# Patient Record
Sex: Male | Born: 1980 | Race: Asian | Hispanic: No | Marital: Single | Smoking: Never smoker
Health system: Southern US, Community
[De-identification: ages and names within clinical notes are randomized; demographics above are authoritative.]

---

## 2003-03-24 ENCOUNTER — Emergency Department (HOSPITAL_COMMUNITY): Admission: EM | Admit: 2003-03-24 | Discharge: 2003-03-24 | Payer: Self-pay | Admitting: Emergency Medicine

## 2005-09-08 ENCOUNTER — Emergency Department (HOSPITAL_COMMUNITY): Admission: AD | Admit: 2005-09-08 | Discharge: 2005-09-08 | Payer: Self-pay | Admitting: Family Medicine

## 2008-03-31 ENCOUNTER — Emergency Department (HOSPITAL_COMMUNITY): Admission: EM | Admit: 2008-03-31 | Discharge: 2008-03-31 | Payer: Self-pay | Admitting: Emergency Medicine

## 2008-08-17 ENCOUNTER — Emergency Department (HOSPITAL_COMMUNITY): Admission: EM | Admit: 2008-08-17 | Discharge: 2008-08-17 | Payer: Self-pay | Admitting: Family Medicine

## 2011-07-30 ENCOUNTER — Ambulatory Visit (INDEPENDENT_AMBULATORY_CARE_PROVIDER_SITE_OTHER): Payer: 59 | Admitting: Emergency Medicine

## 2011-07-30 VITALS — BP 119/68 | HR 74 | Temp 98.3°F | Resp 18 | Ht 69.0 in | Wt 155.0 lb

## 2011-07-30 DIAGNOSIS — J029 Acute pharyngitis, unspecified: Secondary | ICD-10-CM

## 2011-07-30 DIAGNOSIS — J018 Other acute sinusitis: Secondary | ICD-10-CM

## 2011-07-30 MED ORDER — AMOXICILLIN-POT CLAVULANATE 875-125 MG PO TABS: 1.0000 | ORAL_TABLET | Freq: Two times a day (BID) | ORAL | Status: AC

## 2011-07-30 NOTE — Progress Notes (Signed)
  Subjective:    Patient ID: Tim Patterson, male    DOB: 1980/11/06, 31 y.o.   MRN: [id number]  Sore Throat  This is a new problem. The current episode started in the past 7 days. The problem has been unchanged. Neither side of throat is experiencing more pain than the other. There has been no fever. The pain is at a severity of 3/10. Associated symptoms include congestion and swollen glands. Pertinent negatives include no drooling, ear pain, headaches, hoarse voice, plugged ear sensation, neck pain, stridor, trouble swallowing or vomiting. He has had exposure to strep. He has tried nothing for the symptoms. The treatment provided no relief.  Back Pain Pertinent negatives include no headaches.      Review of Systems  Constitutional: Negative.   HENT: Positive for congestion. Negative for ear pain, hoarse voice, drooling, trouble swallowing and neck pain.   Eyes: Negative.   Respiratory: Negative.  Negative for stridor.   Cardiovascular: Negative.   Gastrointestinal: Negative.  Negative for vomiting.  Genitourinary: Negative.   Musculoskeletal: Negative for back pain.  Neurological: Negative for headaches.       Objective:   Physical Exam  Constitutional: He is oriented to person, place, and time. He appears well-developed and well-nourished.  HENT:  Head: Normocephalic and atraumatic.  Right Ear: External ear normal.  Left Ear: External ear normal.  Nose: Mucosal edema present.  Mouth/Throat: Posterior oropharyngeal erythema present. No oropharyngeal exudate or posterior oropharyngeal edema.  Eyes: Pupils are equal, round, and reactive to light.  Neck: Normal range of motion. Neck supple.  Cardiovascular: Normal rate and regular rhythm.   Pulmonary/Chest: Effort normal and breath sounds normal.  Abdominal: Soft.  Lymphadenopathy:    He has no cervical adenopathy.  Neurological: He is alert and oriented to person, place, and time.  Skin: Skin is warm and dry.            Assessment & Plan:   Results for orders placed in visit on 07/30/11  POCT RAPID STREP A (OFFICE)      Component Value Range   Rapid Strep A Screen Negative  Negative

## 2012-04-20 ENCOUNTER — Ambulatory Visit (INDEPENDENT_AMBULATORY_CARE_PROVIDER_SITE_OTHER): Payer: 59 | Admitting: Family Medicine

## 2012-04-20 ENCOUNTER — Ambulatory Visit: Payer: 59

## 2012-04-20 VITALS — BP 110/68 | HR 74 | Temp 98.1°F | Resp 16 | Ht 69.0 in | Wt 159.2 lb

## 2012-04-20 DIAGNOSIS — R05 Cough: Secondary | ICD-10-CM

## 2012-04-20 DIAGNOSIS — J029 Acute pharyngitis, unspecified: Secondary | ICD-10-CM

## 2012-04-20 DIAGNOSIS — J690 Pneumonitis due to inhalation of food and vomit: Secondary | ICD-10-CM

## 2012-04-20 MED ORDER — CLINDAMYCIN HCL 300 MG PO CAPS: 300.0000 mg | ORAL_CAPSULE | Freq: Three times a day (TID) | ORAL | Status: DC

## 2012-04-20 NOTE — Patient Instructions (Addendum)
Let us know if you get worse or have any difficulty breathing.  We will refer you to pulmonology to see if anything further is needed.  In the meantime use the antibiotics to help with your cough.

## 2012-04-20 NOTE — Progress Notes (Signed)
[facility] [address], [place] [address] [phone]  Date:  04/20/2012   Name:  Tim Patterson   DOB:  03/04/1980   MRN:  [id number]  PCP:  No primary provider on file.    Chief Complaint: Cough and Sore Throat   History of Present Illness:  Tim Patterson is a 32 y.o. very pleasant male patient who presents with the following:  He is here today with a cough and ST- this started 4 days ago.   The cough is non- productive.   He thinks he may have aspirated a lemon seed about 10 days ago.  He "can feel it still there when he coughs." He does not have a stuffy or runny nose, occasional sneezing No fever He is otherwise generally healthy. Non- smoker   There is no problem list on file for this patient.   History reviewed. No pertinent past medical history.  History reviewed. No pertinent past surgical history.  History  Substance Use Topics  . Smoking status: Never Smoker   . Smokeless tobacco: Not on file  . Alcohol Use: No    History reviewed. No pertinent family history.  No Known Allergies  Medication list has been reviewed and updated.  No current outpatient prescriptions on file prior to visit.   No current facility-administered medications on file prior to visit.    Review of Systems:  As per HPI- otherwise negative.   Physical Examination: Filed Vitals:   04/20/12 1409  BP: 110/68  Pulse: 74  Temp: 98.1 F (36.7 C)  Resp: 16   Filed Vitals:   04/20/12 1409  Height: 5\' 9"  (1.753 m)  Weight: 159 lb 3.2 oz (72.213 kg)   Body mass index is 23.5 kg/(m^2). Ideal Body Weight: Weight in (lb) to have BMI = 25: 168.9  GEN: WDWN, NAD, Non-toxic, A & O x 3, looks well HEENT: Atraumatic, Normocephalic. Neck supple. No masses, No LAD. Bilateral TM wnl, oropharynx normal.  PEERL,EOMI.   Ears and Nose: No external deformity. CV: RRR, No M/G/R. No JVD. No thrill. No extra heart sounds. PULM: CTA B, no wheezes or rhonchi. No  retractions. No resp. distress. No accessory muscle use. He does have some ronchi in his right lung ABD: S, NT, ND, +BS. No rebound. No HSM. EXTR: No c/c/e NEURO Normal gait.  PSYCH: Normally interactive. Conversant. Not depressed or anxious appearing.  Calm demeanor.   [facility] reading (PRIMARY) by  Dr. Patsy Lager. Chest x-ray:  Increased markings right lung, no definite pneumonia identified CHEST - 2 VIEW  Comparison: Two-view chest 03/24/2003  Findings: The heart size is normal. The lungs are clear. The visualized soft tissues and bony thorax are unremarkable.  IMPRESSION: Negative chest.  Assessment and Plan: Cough - Plan: DG Chest 2 View  Acute pharyngitis  Aspiration pneumonia - Plan: clindamycin (CLEOCIN) 300 MG capsule, Ambulatory referral to Pulmonology  likely viral ST.  He may have aspirated a lemon seed several days ago.  Will cover with clindamycin- let him know this can cause diarrhea, stop and let us know if he has any problems.  If any problems breathing or other acute changes seek help right away  See patient instructions for more details.     Abbe Amsterdam, MD

## 2012-05-08 ENCOUNTER — Institutional Professional Consult (permissible substitution): Payer: Self-pay | Admitting: Internal Medicine

## 2012-05-21 ENCOUNTER — Ambulatory Visit (INDEPENDENT_AMBULATORY_CARE_PROVIDER_SITE_OTHER): Payer: 59 | Admitting: Family Medicine

## 2012-05-21 VITALS — BP 109/69 | HR 70 | Temp 97.9°F | Resp 16 | Ht 68.5 in | Wt 155.0 lb

## 2012-05-21 DIAGNOSIS — J029 Acute pharyngitis, unspecified: Secondary | ICD-10-CM

## 2012-05-21 DIAGNOSIS — J039 Acute tonsillitis, unspecified: Secondary | ICD-10-CM

## 2012-05-21 LAB — POCT RAPID STREP A (OFFICE): Rapid Strep A Screen: NEGATIVE

## 2012-05-21 MED ORDER — AZITHROMYCIN 250 MG PO TABS: ORAL_TABLET | ORAL | Status: DC

## 2012-05-21 NOTE — Patient Instructions (Addendum)
Vim H?ng Do Vi Rt Vim h?ng do vi rt l b?nh nhi?m trng do vi rt t?o ra t?y ??, ?au v s?ng (vim) ? c? h?ng. N c th? ly t? ng??i sang ng??i (d? ly). NGUYN NHN Vim h?ng do vi rt gy ra do ht ph?i m?t l??ng l?n vi trng nh?t ??nh ???c g?i l vi rt. Nhi?u vi rt khc nhau gy ra vim h?ng do vi rt. TRI?U CH?NG Cc tri?u ch?ng c?a vim h?ng do vi rt bao g?m:  ?au h?ng.  M?t m?i.  Ngh?t m?i.  S?t nh?.  T?c ngh?n.  Ho. ?I?U TR? ?i?u tr? bao g?m ngh? ng?i, u?ng nhi?u n??c v s? d?ng thu?c mua tr?c ti?p t?i hi?u thu?c (???c ph duy?t b?i chuyn gia ch?m [place] y t? c?a b?n). H??NG D?N CH?M [place] T?I NH   U?ng ?? n??c ?? gi? cho n??c ti?u trong ho?c vng nh?t.  ?n th?c ?n m?m, l?nh nh? kem, kem ?ng ? ho?c mn trng mi?ng gelatin.  [place] mi?ng b?ng n??c mu?i ?m (1 mu?ng c ph mu?i m?i 1 qt hay 1 lt n??c).  N?u trn 7 tu?i, vin ng?m h?ng c th? ???c s? d?ng m?t cch an ton.  Ch? s? d?ng thu?c mua tr?c ti?p t?i hi?u thu?c ho?c thu?c theo toa ?? gi?m ?au, gi?m s? kh ch?u ho?c h? s?t theo ch? d?n c?a chuyn gia ch?m  y t? c?a b?n. Khng dng atpirin. ?? gip ng?n ng?a ly lan vi rt vim h?ng cho ng??i khc, hy trnh:  Ti?p xc mi?ng mnh v?i mi?ng ng??i khc.  Dng chung d?ng c? ?n u?ng.  Ho khi c ng??i khc ? c?nh. HY THAM V?N V?I CHUYN GIA Y T? N?U:   B?n kh h?n trong vi ngy, sau ? tr? nn t?i t? h?n.  B?n b? s?t ho?c ?au khng thuyn gi?m sau khi s? d?ng thu?c gi?m ?au.  C b?t k? thay ??i no khc khi?n b?n lo l?ng. Document Released: 02/04/2005 Document Revised: 04/29/2011 [facility] Patient Information 2013 [place], [place].

## 2012-05-21 NOTE — Progress Notes (Signed)
  Subjective:    Patient ID: Tim Patterson, male    DOB: 08/21/1980, 32 y.o.   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  Pt comes in today with headache, runny nose and over all not feeling well since yesterday. He feels like something is stuck in his throat. He has been complaining of a sore throat. He denies any noted fever.   He is a Location manager.  Review of Systems Unknown fever    Objective:   Physical Exam NAD HEENT:  Normal TM's and canals; red pharynx with thin white film,  Neck:  Supple, 1+ cervical adenop anteriorly Skin:  Warm and dry Chest:  Clear Heart: reg, no murmur Results for orders placed in visit on 05/21/12  POCT RAPID STREP A (OFFICE)      Result Value Range   Rapid Strep A Screen Negative  Negative       Assessment & Plan:  Acute pharyngitis  Plan:  OOW tomorrow Salt water gargles

## 2012-05-22 ENCOUNTER — Institutional Professional Consult (permissible substitution): Payer: Self-pay | Admitting: Internal Medicine

## 2012-06-03 ENCOUNTER — Ambulatory Visit (INDEPENDENT_AMBULATORY_CARE_PROVIDER_SITE_OTHER): Payer: 59 | Admitting: Internal Medicine

## 2012-06-03 ENCOUNTER — Encounter: Payer: Self-pay | Admitting: *Deleted

## 2012-06-03 ENCOUNTER — Encounter: Payer: Self-pay | Admitting: Internal Medicine

## 2012-06-03 VITALS — BP 120/78 | HR 91 | Temp 98.0°F | Ht 68.0 in | Wt 161.6 lb

## 2012-06-03 DIAGNOSIS — D869 Sarcoidosis, unspecified: Secondary | ICD-10-CM

## 2012-06-03 DIAGNOSIS — R05 Cough: Secondary | ICD-10-CM

## 2012-06-03 MED ORDER — PREDNISONE (PAK) 10 MG PO TABS: ORAL_TABLET | ORAL | Status: DC

## 2012-06-03 MED ORDER — METHYLPREDNISOLONE ACETATE 80 MG/ML IJ SUSP: 120.0000 mg | Freq: Once | INTRAMUSCULAR | Status: DC

## 2012-06-03 MED ORDER — TRAMADOL HCL 50 MG PO TABS: ORAL_TABLET | ORAL | Status: DC

## 2012-06-03 NOTE — Patient Instructions (Addendum)
The key to effective treatment for your cough is eliminating the non-stop cycle of cough you're stuck in long enough to let your airway heal completely and then see if there is anything still making you cough once you stop the cough suppression, but this should take no more than 5 days to figuure out  First take delsym two tsp every 12 hours and supplement if needed with  tramadol 50 mg up to 2 every 4 hours to suppress the urge to cough. Swallowing water or using ice chips/non mint and menthol containing candies (such as lifesavers or sugarless jolly ranchers) are also effective.  You should rest your voice and avoid activities that you know make you cough.  Once you have eliminated the cough for 3 straight days try reducing the tramadol first,  then the delsym as tolerated.    Try prilosec 20mg   Take 30-60 min before first meal of the day and Pepcid 20 mg one bedtime until cough is completely gone for at least a week without the need for cough suppression   GERD (REFLUX)  is an extremely common cause of respiratory symptoms, many times with no significant heartburn at all.    It can be treated with medication, but also with lifestyle changes including avoidance of late meals, excessive alcohol, smoking cessation, and avoid fatty foods, chocolate, peppermint, colas, red wine, and acidic juices such as orange juice.  NO MINT OR MENTHOL PRODUCTS SO NO COUGH DROPS  USE SUGARLESS CANDY INSTEAD (jolley ranchers or Stover's)  NO OIL BASED VITAMINS - use powdered substitutes.    Prednisone 10 mg take  4 each am x 2 days,   2 each am x 2 days,  1 each am x 2days and stop    If not better by 4/21 call me to set up bronchoscopy at outpatient registration at [facility]

## 2012-06-03 NOTE — Progress Notes (Signed)
  Subjective:    Patient ID: Tim Patterson, male    DOB: 07-Feb-1981, 32 y.o.   MRN: [id number]  HPI  32 yo vietnamese male never smoker with limited English ? Asp lemon seed around Feb 21 with sense of scratchy sore throat seen 04/20/12 in UC and given cleocin > no better so referred 06/03/12 to Pulmonary clinic by Dr Freddi Che Copland  06/03/12 1st pulmonary ov cc persistent dry hacking cough 24/7 x since 3/21 assoc with gagging and can't catch his breath but only sob if coughing.  gen ant chest discomfort with coughing.  No obvious daytime variabilty or   prev chronic coughor asssoc or chest tightness, subjective wheeze overt sinus or hb symptoms. No unusual exp hx or h/o childhood pna/ asthma or premature birth to his knowledge.     Also denies any obvious fluctuation of symptoms with weather or environmental changes or other aggravating or alleviating factors except as outlined above   Review of Systems  Constitutional: Negative for fever, chills, activity change, appetite change and unexpected weight change.  HENT: Positive for sore throat and trouble swallowing. Negative for congestion, rhinorrhea, sneezing, dental problem, voice change and postnasal drip.   Eyes: Negative for visual disturbance.  Respiratory: Positive for cough and shortness of breath. Negative for choking.   Cardiovascular: Positive for chest pain. Negative for leg swelling.  Gastrointestinal: Negative for nausea, vomiting and abdominal pain.  Genitourinary: Negative for difficulty urinating.  Musculoskeletal: Negative for arthralgias.  Skin: Negative for rash.  Psychiatric/Behavioral: Negative for behavioral problems and confusion.       Objective:   Physical Exam  amb wm points to suprasternal notch where he feels the lemon seed is stuck with freq throat clearing.  Wt Readings from Last 3 Encounters:  06/03/12 161 lb 9.6 oz (73.301 kg)  05/21/12 155 lb (70.308 kg)  04/20/12 159 lb 3.2 oz (72.213 kg)    HEENT: nl  dentition, turbinates, and orophanx. Nl external ear canals without cough reflex   NECK :  without JVD/Nodes/TM/ nl carotid upstrokes bilaterally   LUNGS: no acc muscle use, clear to A and P bilaterally without cough on insp or exp maneuvers   CV:  RRR  no s3 or murmur or increase in P2, no edema   ABD:  soft and nontender with nl excursion in the supine position. No bruits or organomegaly, bowel sounds nl  MS:  warm without deformities, calf tenderness, cyanosis or clubbing  SKIN: warm and dry without lesions    NEURO:  alert, approp, no deficits   cxr 04/20/12  Negative chest.       Assessment & Plan:

## 2012-06-05 DIAGNOSIS — R05 Cough: Secondary | ICD-10-CM | POA: Insufficient documentation

## 2012-06-05 NOTE — Assessment & Plan Note (Signed)
The most common causes of chronic cough in immunocompetent adults include the following: upper airway cough syndrome (UACS), previously referred to as postnasal drip syndrome (PNDS), which is caused by variety of rhinosinus conditions; (2) asthma; (3) GERD; (4) chronic bronchitis from cigarette smoking or other inhaled environmental irritants; (5) nonasthmatic eosinophilic bronchitis; and (6) bronchiectasis.   These conditions, singly or in combination, have accounted for up to 94% of the causes of chronic cough in prospective studies.   Other conditions have constituted no >6% of the causes in prospective studies These have included bronchogenic carcinoma, chronic interstitial pneumonia, sarcoidosis, left ventricular failure, ACEI-induced cough, and aspiration from a condition associated with pharyngeal dysfunction.    Chronic cough is often simultaneously caused by more than one condition. A single cause has been found from 38 to 82% of the time, multiple causes from 18 to 62%. Multiply caused cough has been the result of three diseases up to 42% of the time.       Of the three most common causes of chronic cough, only one (GERD)  can actually cause the other two (asthma and post nasal drip syndrome)  and perpetuate the cylce of cough inducing airway trauma, inflammation, heightened sensitivity to reflux which is prompted by the cough itself via a cyclical mechanism.    This may partially respond to steroids and look like asthma and post nasal drainage but never erradicated completely unless the cough and the secondary reflux are eliminated, preferably both at the same time.  While not intuitively obvious, many patients with chronic low grade reflux do not cough until there is a secondary insult that disturbs the protective epithelial barrier and exposes sensitive nerve endings.  This can be viral or direct physical injury such as with an endotracheal tube.   The point is that once this occurs, it is  difficult to eliminate using anything but a maximally effective acid suppression regimen at least in the short run, accompanied by an appropriate diet to address non acid GERD.   He is convinced something is stuck in his throat but this may all be cyclical coughing esp since cxr and exam so clear and he points to his suprasternal notch as to the location of the "stuck seed" which would be nearly impossible given that healthy people can generate cough velocities that approach 80% of the speed of sound.   If not improving though may need fob just to prove to him and his wife that this is not from a "stuck seed"  See instructions for specific recommendations which were reviewed directly with the patient who was given a copy with highlighter outlining the key components.

## 2012-06-16 ENCOUNTER — Telehealth: Payer: Self-pay | Admitting: Internal Medicine

## 2012-06-16 DIAGNOSIS — R05 Cough: Secondary | ICD-10-CM

## 2012-06-16 NOTE — Telephone Encounter (Signed)
I spoke with spouse and she stated MW advised them if pt cough was not better them to call back and would set up bronch. I advised her MW is aware until Thursday but will send the message to him for when he returns. They would like to get this set up MW. Please advise thanks

## 2012-06-16 NOTE — Telephone Encounter (Signed)
The instruction was to call back 4/21 if not better and I can't do fob until end of next week - if that's not satisfactory they will need to see one of my partners in office to set up bronch in interim

## 2012-06-16 NOTE — Telephone Encounter (Signed)
Spoke with pt's spouse and informed of Dr Thurston Hole recommendations .  She states that she will talk to pt and see if he can wait until end of next week for FOB and get back with [place].  Will await call back.

## 2012-06-17 NOTE — Telephone Encounter (Signed)
I spoke with pt spouse and she stated pt would wait until next week to have this done. Please advise MW thanks

## 2012-06-22 NOTE — Addendum Note (Signed)
Addended by: Darrell Jewel on: 06/22/2012 01:42 PM   Modules accepted: Orders

## 2012-06-22 NOTE — Telephone Encounter (Signed)
Spoke with the pt wife and set appt for Monday at 9am with CXR. Carron Curie, CMA

## 2012-06-22 NOTE — Telephone Encounter (Signed)
Schedule f/u ov with all active meds in hand including otcs with cxr same day

## 2012-06-29 ENCOUNTER — Encounter: Payer: Self-pay | Admitting: *Deleted

## 2012-06-29 ENCOUNTER — Ambulatory Visit (INDEPENDENT_AMBULATORY_CARE_PROVIDER_SITE_OTHER): Payer: 59 | Admitting: Internal Medicine

## 2012-06-29 ENCOUNTER — Encounter: Payer: Self-pay | Admitting: Internal Medicine

## 2012-06-29 ENCOUNTER — Ambulatory Visit (INDEPENDENT_AMBULATORY_CARE_PROVIDER_SITE_OTHER)
Admission: RE | Admit: 2012-06-29 | Discharge: 2012-06-29 | Disposition: A | Discharge status: Home / Self Care | Payer: 59 | Source: Ambulatory Visit | Attending: Internal Medicine | Admitting: Internal Medicine

## 2012-06-29 VITALS — BP 106/70 | HR 77 | Temp 98.1°F | Ht 68.0 in | Wt 158.0 lb

## 2012-06-29 DIAGNOSIS — R05 Cough: Secondary | ICD-10-CM

## 2012-06-29 DIAGNOSIS — R059 Cough, unspecified: Secondary | ICD-10-CM

## 2012-06-29 MED ORDER — PANTOPRAZOLE SODIUM 40 MG PO TBEC: 40.0000 mg | DELAYED_RELEASE_TABLET | Freq: Every day | ORAL | Status: DC

## 2012-06-29 NOTE — Assessment & Plan Note (Signed)
-   Acute onset p ? Asp lemon seed 04/10/12  Very seriously doubt there is a retained lemon seed LUL with nothing on xray or exam now months after onset of symptoms but pt convinced something is stuck there and can't or won't follow instructions re elimination of cyclical cough.  Discussed in detail all the  indications, usual  risks and alternatives  relative to the benefits with patient who agrees to proceed with bronchoscopy  And in meantime try again to get 24 acid suppression and follow diet.  Wife served as interpreter but noted at end of ov she demonstrated a skeptical affect re dx and management options so I asked her what she was studying in school with response "I'd rather not tell you, my husband is the pt" I made it clear to her and as much as possible to him that we are all on the same team to help get rid of this cough and offered a second opinion which she/ he declined.

## 2012-06-29 NOTE — Patient Instructions (Addendum)
Pantoprazole (protonix) 40 mg   Take 30-60 min before first meal of the day and Pepcid 20 mg one bedtime until return to office - this is the best way to tell whether stomach acid is contributing to your problem.    GERD (REFLUX)  is an extremely common cause of respiratory symptoms, many times with no significant heartburn at all.    It can be treated with medication, but also with lifestyle changes including avoidance of late meals, excessive alcohol, smoking cessation, and avoid fatty foods, chocolate, peppermint, colas, red wine, and acidic juices such as orange juice.  NO MINT OR MENTHOL PRODUCTS SO NO COUGH DROPS  USE SUGARLESS CANDY INSTEAD (jolley ranchers or Stover's)  NO OIL BASED VITAMINS - use powdered substitutes.    You are scheduled for bronchoscopy for May 19th - come to outpatient registration at [facility] noon and ok for juices in am but nothing after 8 am

## 2012-06-29 NOTE — Progress Notes (Signed)
Subjective:    Patient ID: Tim Patterson, male    DOB: 1980/02/20, 32 y.o.   MRN: [id number]  HPI  32 yo vietnamese male never smoker with limited English ? Asp lemon seed around Feb 21 with sense of scratchy sore throat seen 04/20/12 in UC and given cleocin > no better so referred 06/03/12 to Pulmonary clinic by Dr Freddi Che Copland  06/03/12 1st pulmonary ov cc persistent dry hacking cough 24/7 x since 2/21 assoc with gagging and can't catch his breath but only sob if coughing.  gen ant chest discomfort with coughing. rec First take delsym two tsp every 12 hours and supplement if needed with  tramadol 50 mg up to 2 every 4 hours to suppress the urge to cough.    Try prilosec 20mg   Take 30-60 min before first meal of the day and Pepcid 20 mg one bedtime until cough is completely gone for at least a week without the need for cough suppression GERD diet   Prednisone 10 mg take  4 each am x 2 days,   2 each am x 2 days,  1 each am x 2days and stop    06/29/2012 f/u ov/Keyani Rigdon re cough Chief Complaint  Patient presents with  . Follow-up    Cough is slightly improved since the last visit, mainly non prod but sometimes produces minimal yellow to clear sputum.   does not appear to have used acid suppression as rec nor ever eliminated cyclical cough. No sob. Cough seems worse p supper than any other time  No obvious daytime variabilty or   prev chronic coughor asssoc or chest tightness, subjective wheeze overt sinus or hb symptoms. No unusual exp hx or h/o childhood pna/ asthma or premature birth to his knowledge.   Current Medications, Allergies, Past Medical History, Past Surgical History, Family History, and Social History were reviewed in [employer] record.  ROS  The following are not active complaints unless bolded sore throat, dysphagia, dental problems, itching, sneezing,  nasal congestion or excess/ purulent secretions, ear ache,   fever, chills, sweats, unintended wt loss,  pleuritic or exertional cp, hemoptysis,  orthopnea pnd or leg swelling, presyncope, palpitations, heartburn, abdominal pain, anorexia, nausea, vomiting, diarrhea  or change in bowel or urinary habits, change in stools or urine, dysuria,hematuria,  rash, arthralgias, visual complaints, headache, numbness weakness or ataxia or problems with walking or coordination,  change in mood/affect or memory.              Objective:   Physical Exam  amb wm points to  L upper parasternal area where feels something stuck, speaks very little [place], wife serving as Equities trader.   06/29/2012       158  Wt Readings from Last 3 Encounters:  06/03/12 161 lb 9.6 oz (73.301 kg)  05/21/12 155 lb (70.308 kg)  04/20/12 159 lb 3.2 oz (72.213 kg)    HEENT: nl dentition, turbinates, and orophanx. Nl external ear canals without cough reflex   NECK :  without JVD/Nodes/TM/ nl carotid upstrokes bilaterally   LUNGS: no acc muscle use, clear to A and P bilaterally without cough on insp or exp maneuvers   CV:  RRR  no s3 or murmur or increase in P2, no edema   ABD:  soft and nontender with nl excursion in the supine position. No bruits or organomegaly, bowel sounds nl  MS:  warm without deformities, calf tenderness, cyanosis or clubbing  SKIN: warm and dry without lesions  NEURO:  alert, approp, no deficits      CXR  06/29/2012 :  Normal chest radiography.         Assessment & Plan:

## 2012-07-06 ENCOUNTER — Inpatient Hospital Stay (HOSPITAL_COMMUNITY): Admission: RE | Admit: 2012-07-06 | Payer: 59 | Source: Ambulatory Visit

## 2012-07-06 ENCOUNTER — Ambulatory Visit (HOSPITAL_COMMUNITY): Admission: RE | Admit: 2012-07-06 | Payer: 59 | Source: Ambulatory Visit | Admitting: Internal Medicine

## 2012-07-06 ENCOUNTER — Encounter (HOSPITAL_COMMUNITY): Admission: RE | Payer: Self-pay | Source: Ambulatory Visit

## 2012-07-06 SURGERY — VIDEO BRONCHOSCOPY WITHOUT FLUORO
Anesthesia: Moderate Sedation | Laterality: Bilateral

## 2012-09-10 ENCOUNTER — Ambulatory Visit: Payer: 59

## 2012-09-10 ENCOUNTER — Ambulatory Visit (INDEPENDENT_AMBULATORY_CARE_PROVIDER_SITE_OTHER): Payer: 59 | Admitting: Family Medicine

## 2012-09-10 VITALS — BP 110/78 | HR 85 | Temp 98.3°F | Resp 18 | Ht 69.0 in | Wt 155.0 lb

## 2012-09-10 DIAGNOSIS — M545 Low back pain: Secondary | ICD-10-CM

## 2012-09-10 DIAGNOSIS — S39011A Strain of muscle, fascia and tendon of abdomen, initial encounter: Secondary | ICD-10-CM

## 2012-09-10 DIAGNOSIS — IMO0002 Reserved for concepts with insufficient information to code with codable children: Secondary | ICD-10-CM

## 2012-09-10 DIAGNOSIS — R1013 Epigastric pain: Secondary | ICD-10-CM

## 2012-09-10 DIAGNOSIS — S39012A Strain of muscle, fascia and tendon of lower back, initial encounter: Secondary | ICD-10-CM

## 2012-09-10 DIAGNOSIS — S335XXA Sprain of ligaments of lumbar spine, initial encounter: Secondary | ICD-10-CM

## 2012-09-10 LAB — POCT URINALYSIS DIPSTICK
Ketones, UA: NEGATIVE
Leukocytes, UA: NEGATIVE
Nitrite, UA: NEGATIVE
Protein, UA: NEGATIVE
pH, UA: 6

## 2012-09-10 LAB — POCT UA - MICROSCOPIC ONLY
Casts, Ur, LPF, POC: NEGATIVE
Mucus, UA: NEGATIVE
Yeast, UA: NEGATIVE

## 2012-09-10 MED ORDER — CYCLOBENZAPRINE HCL 5 MG PO TABS: ORAL_TABLET | ORAL | Status: DC

## 2012-09-10 NOTE — Progress Notes (Signed)
Subjective:    Patient ID: Tim Patterson, male    DOB: 1980/11/09, 32 y.o.   MRN: [id number]  HPI [name] is a 32 y.o. 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   Lower R back pain started yesterday around 4pm.  Did jump off trash can at work about 4 hours earlier, but no pain at the time, NKI. Noticed soreness in R lower back.   Also with some slight upper abdomen soreness - middle (not with lying down - just notices abd pain and back pain with standing).  No n/v/fever or change in stools. No prior similar sx's. No known hx of kidney stones.   Tx: topical spray.   SH: Works as Location manager.  Review of Systems  Constitutional: Negative for chills.  Gastrointestinal: Positive for abdominal pain. Negative for nausea, vomiting, diarrhea, constipation (normal BM this am. ), blood in stool, abdominal distention and anal bleeding.  Genitourinary: Negative for dysuria, urgency, frequency, hematuria, decreased urine volume, discharge, difficulty urinating, penile pain and testicular pain.  Musculoskeletal: Positive for back pain.  Skin: Negative for rash.       Objective:   Physical Exam  Vitals reviewed. Constitutional: He is oriented to person, place, and time. He appears well-developed and well-nourished.  HENT:  Head: Normocephalic and atraumatic.  Neck: Normal range of motion.  Pulmonary/Chest: Effort normal.  Abdominal: Soft. Bowel sounds are normal. He exhibits no distension. There is no tenderness. There is no rebound and no guarding.  nontender on exam.  Only notes soreness with standing. No defect appreciated.   Musculoskeletal: He exhibits tenderness.       Lumbar back: He exhibits tenderness and spasm. He exhibits normal range of motion and no bony tenderness.  Neurological: He is alert and oriented to person, place, and time. He has normal strength. No sensory deficit. He displays no Babinski's sign on the right side. He displays no Babinski's sign on the left side.  Reflex Scores:  Patellar reflexes are 2+ on the right side and 2+ on the left side.      Achilles reflexes are 2+ on the right side and 2+ on the left side. Able to heel and toe walk without difficulty. Negative straight leg raise.   Skin: Skin is warm and dry.  Psychiatric: He has a normal mood and affect. His behavior is normal.    [facility] reading (PRIMARY) by  Dr. Neva Seat: LS spine: LS spine: no apparent fx, NAD.  Results for orders placed in visit on 09/10/12  POCT URINALYSIS DIPSTICK      Result Value Range   Color, UA yellow     Clarity, UA clear     Glucose, UA neg     Bilirubin, UA neg     Ketones, UA neg     Spec Grav, UA >=1.030     Blood, UA neg     pH, UA 6.0     Protein, UA neg     Urobilinogen, UA 0.2     Nitrite, UA neg     Leukocytes, UA Negative    POCT UA - MICROSCOPIC ONLY      Result Value Range   WBC, Ur, HPF, POC neg     RBC, urine, microscopic neg     Bacteria, U Microscopic neg     Mucus, UA neg     Epithelial cells, urine per micros neg     Crystals, Ur, HPF, POC neg     Casts, Ur, LPF, POC neg  Yeast, UA neg         Assessment & Plan:  Tim Patterson is a 32 y.o. male Lumbar pain - Plan: POCT urinalysis dipstick, POCT UA - Microscopic Only, DG Lumbar Spine Complete  Abdominal pain, epigastric - Plan: POCT urinalysis dipstick, POCT UA - Microscopic Only, DG Lumbar Spine Complete  Abdominal wall strain, initial encounter - Plan: cyclobenzaprine (FLEXERIL) 5 MG tablet  Low back strain, initial encounter - Plan: cyclobenzaprine (FLEXERIL) 5 MG tablet  Suspected abd wall and low back strain after jumping yesterday.  Reassuring U/a. Trial of otc NSAID prn, flexeril if needed- SED, and heat/ice/rom as tolerated.  recheck if not improved in next 4-5 days. Sooner if worse.  HEP per handout, letter for work today - out x 2 days, unless better tomorrow.   Meds ordered this encounter  Medications  . cyclobenzaprine (FLEXERIL) 5 MG tablet    Sig: 1 pill by mouth up to every  8 hours as needed. Start with one pill by mouth each bedtime as needed due to sedation    Dispense:  15 tablet    Refill:  0   Patient Instructions  advil or alleve over the counter if needed. Heat or ice to area for 15 minutes at a time as needed. Gentle stretches and range of motion - see below.  If not improving in next 4-5 days, or worse sooner - return for recheck.  Return to the clinic or go to the nearest emergency room if any of your symptoms worsen or new symptoms occur. Low Back Strain with Rehab A strain is an injury in which a tendon or muscle is torn. The muscles and tendons of the lower back are vulnerable to strains. However, these muscles and tendons are very strong and require a great force to be injured. Strains are classified into three categories. Grade 1 strains cause pain, but the tendon is not lengthened. Grade 2 strains include a lengthened ligament, due to the ligament being stretched or partially ruptured. With grade 2 strains there is still function, although the function may be decreased. Grade 3 strains involve a complete tear of the tendon or muscle, and function is usually impaired. SYMPTOMS   Pain in the lower back.  Pain that affects one side more than the other.  Pain that gets worse with movement and may be felt in the hip, buttocks, or back of the thigh.  Muscle spasms of the muscles in the back.  Swelling along the muscles of the back.  Loss of strength of the back muscles.  Crackling sound (crepitation) when the muscles are touched. CAUSES  Lower back strains occur when a force is placed on the muscles or tendons that is greater than they can handle. Common causes of injury include:  Prolonged overuse of the muscle-tendon units in the lower back, usually from incorrect posture.  A single violent injury or force applied to the back. RISK INCREASES WITH:  Sports that involve twisting forces on the spine or a lot of bending at the waist (football,  rugby, weightlifting, bowling, golf, tennis, speed skating, racquetball, swimming, running, gymnastics, diving).  Poor strength and flexibility.  Failure to warm up properly before activity.  Family history of lower back pain or disk disorders.  Previous back injury or surgery (especially fusion).  Poor posture with lifting, especially heavy objects.  Prolonged sitting, especially with poor posture. PREVENTION   Learn and use proper posture when sitting or lifting (maintain proper posture when sitting, lift  using the knees and legs, not at the waist).  Warm up and stretch properly before activity.  Allow for adequate recovery between workouts.  Maintain physical fitness:  Strength, flexibility, and endurance.  Cardiovascular fitness. PROGNOSIS  If treated properly, lower back strains usually heal within 6 weeks. RELATED COMPLICATIONS   Recurring symptoms, resulting in a chronic problem.  Chronic inflammation, scarring, and partial muscle-tendon tear.  Delayed healing or resolution of symptoms.  Prolonged disability. TREATMENT  Treatment first involves the use of ice and medicine, to reduce pain and inflammation. The use of strengthening and stretching exercises may help reduce pain with activity. These exercises may be performed at home or with a therapist. Severe injuries may require referral to a therapist for further evaluation and treatment, such as ultrasound. Your caregiver may advise that you wear a back brace or corset, to help reduce pain and discomfort. Often, prolonged bed rest results in greater harm then benefit. Corticosteroid injections may be recommended. However, these should be reserved for the most serious cases. It is important to avoid using your back when lifting objects. At night, sleep on your back on a firm mattress with a pillow placed under your knees. If non-surgical treatment is unsuccessful, surgery may be needed.  MEDICATION   If pain medicine  is needed, nonsteroidal anti-inflammatory medicines (aspirin and ibuprofen), or other minor pain relievers (acetaminophen), are often advised.  Do not take pain medicine for 7 days before surgery.  Prescription pain relievers may be given, if your caregiver thinks they are needed. Use only as directed and only as much as you need.  Ointments applied to the skin may be helpful.  Corticosteroid injections may be given by your caregiver. These injections should be reserved for the most serious cases, because they may only be given a certain number of times. HEAT AND COLD  Cold treatment (icing) should be applied for 10 to 15 minutes every 2 to 3 hours for inflammation and pain, and immediately after activity that aggravates your symptoms. Use ice packs or an ice massage.  Heat treatment may be used before performing stretching and strengthening activities prescribed by your caregiver, physical therapist, or athletic trainer. Use a heat pack or a warm water soak. SEEK MEDICAL CARE IF:   Symptoms get worse or do not improve in 2 to 4 weeks, despite treatment.  You develop numbness, weakness, or loss of bowel or bladder function.  New, unexplained symptoms develop. (Drugs used in treatment may produce side effects.) EXERCISES  RANGE OF MOTION (ROM) AND STRETCHING EXERCISES - Low Back Strain Most people with lower back pain will find that their symptoms get worse with excessive bending forward (flexion) or arching at the lower back (extension). The exercises which will help resolve your symptoms will focus on the opposite motion.  Your physician, physical therapist or athletic trainer will help you determine which exercises will be most helpful to resolve your lower back pain. Do not complete any exercises without first consulting with your caregiver. Discontinue any exercises which make your symptoms worse until you speak to your caregiver.  If you have pain, numbness or tingling which travels  down into your buttocks, leg or foot, the goal of the therapy is for these symptoms to move closer to your back and eventually resolve. Sometimes, these leg symptoms will get better, but your lower back pain may worsen. This is typically an indication of progress in your rehabilitation. Be very alert to any changes in your symptoms and the  activities in which you participated in the 24 hours prior to the change. Sharing this information with your caregiver will allow him/her to most efficiently treat your condition.  These exercises may help you when beginning to rehabilitate your injury. Your symptoms may resolve with or without further involvement from your physician, physical therapist or athletic trainer. While completing these exercises, remember:  Restoring tissue flexibility helps normal motion to return to the joints. This allows healthier, less painful movement and activity.  An effective stretch should be held for at least 30 seconds.  A stretch should never be painful. You should only feel a gentle lengthening or release in the stretched tissue. FLEXION RANGE OF MOTION AND STRETCHING EXERCISES: STRETCH  Flexion, Single Knee to Chest   Lie on a firm bed or floor with both legs extended in front of you.  Keeping one leg in contact with the floor, bring your opposite knee to your chest. Hold your leg in place by either grabbing behind your thigh or at your knee.  Pull until you feel a gentle stretch in your lower back. Hold __________ seconds.  Slowly release your grasp and repeat the exercise with the opposite side. Repeat __________ times. Complete this exercise __________ times per day.  STRETCH  Flexion, Double Knee to Chest   Lie on a firm bed or floor with both legs extended in front of you.  Keeping one leg in contact with the floor, bring your opposite knee to your chest.  Tense your stomach muscles to support your back and then lift your other knee to your chest. Hold your  legs in place by either grabbing behind your thighs or at your knees.  Pull both knees toward your chest until you feel a gentle stretch in your lower back. Hold __________ seconds.  Tense your stomach muscles and slowly return one leg at a time to the floor. Repeat __________ times. Complete this exercise __________ times per day.  STRETCH  Low Trunk Rotation  Lie on a firm bed or floor. Keeping your legs in front of you, bend your knees so they are both pointed toward the ceiling and your feet are flat on the floor.  Extend your arms out to the side. This will stabilize your upper body by keeping your shoulders in contact with the floor.  Gently and slowly drop both knees together to one side until you feel a gentle stretch in your lower back. Hold for __________ seconds.  Tense your stomach muscles to support your lower back as you bring your knees back to the starting position. Repeat the exercise to the other side. Repeat __________ times. Complete this exercise __________ times per day  EXTENSION RANGE OF MOTION AND FLEXIBILITY EXERCISES: STRETCH  Extension, Prone on Elbows   Lie on your stomach on the floor, a bed will be too soft. Place your palms about shoulder width apart and at the height of your head.  Place your elbows under your shoulders. If this is too painful, stack pillows under your chest.  Allow your body to relax so that your hips drop lower and make contact more completely with the floor.  Hold this position for __________ seconds.  Slowly return to lying flat on the floor. Repeat __________ times. Complete this exercise __________ times per day.  RANGE OF MOTION  Extension, Prone Press Ups  Lie on your stomach on the floor, a bed will be too soft. Place your palms about shoulder width apart and at the height  of your head.  Keeping your back as relaxed as possible, slowly straighten your elbows while keeping your hips on the floor. You may adjust the placement  of your hands to maximize your comfort. As you gain motion, your hands will come more underneath your shoulders.  Hold this position __________ seconds.  Slowly return to lying flat on the floor. Repeat __________ times. Complete this exercise __________ times per day.  RANGE OF MOTION- Quadruped, Neutral Spine   Assume a hands and knees position on a firm surface. Keep your hands under your shoulders and your knees under your hips. You may place padding under your knees for comfort.  Drop your head and point your tail bone toward the ground below you. This will round out your lower back like an angry cat. Hold this position for __________ seconds.  Slowly lift your head and release your tail bone so that your back sags into a large arch, like an old horse.  Hold this position for __________ seconds.  Repeat this until you feel limber in your lower back.  Now, find your "sweet spot." This will be the most comfortable position somewhere between the two previous positions. This is your neutral spine. Once you have found this position, tense your stomach muscles to support your lower back.  Hold this position for __________ seconds. Repeat __________ times. Complete this exercise __________ times per day.  STRENGTHENING EXERCISES - Low Back Strain These exercises may help you when beginning to rehabilitate your injury. These exercises should be done near your "sweet spot." This is the neutral, low-back arch, somewhere between fully rounded and fully arched, that is your least painful position. When performed in this safe range of motion, these exercises can be used for people who have either a flexion or extension based injury. These exercises may resolve your symptoms with or without further involvement from your physician, physical therapist or athletic trainer. While completing these exercises, remember:   Muscles can gain both the endurance and the strength needed for everyday activities  through controlled exercises.  Complete these exercises as instructed by your physician, physical therapist or athletic trainer. Increase the resistance and repetitions only as guided.  You may experience muscle soreness or fatigue, but the pain or discomfort you are trying to eliminate should never worsen during these exercises. If this pain does worsen, stop and make certain you are following the directions exactly. If the pain is still present after adjustments, discontinue the exercise until you can discuss the trouble with your caregiver. STRENGTHENING Deep Abdominals, Pelvic Tilt  Lie on a firm bed or floor. Keeping your legs in front of you, bend your knees so they are both pointed toward the ceiling and your feet are flat on the floor.  Tense your lower abdominal muscles to press your lower back into the floor. This motion will rotate your pelvis so that your tail bone is scooping upwards rather than pointing at your feet or into the floor.  With a gentle tension and even breathing, hold this position for __________ seconds. Repeat __________ times. Complete this exercise __________ times per day.  STRENGTHENING  Abdominals, Crunches   Lie on a firm bed or floor. Keeping your legs in front of you, bend your knees so they are both pointed toward the ceiling and your feet are flat on the floor. Cross your arms over your chest.  Slightly tip your chin down without bending your neck.  Tense your abdominals and slowly lift your trunk  high enough to just clear your shoulder blades. Lifting higher can put excessive stress on the lower back and does not further strengthen your abdominal muscles.  Control your return to the starting position. Repeat __________ times. Complete this exercise __________ times per day.  STRENGTHENING  Quadruped, Opposite UE/LE Lift   Assume a hands and knees position on a firm surface. Keep your hands under your shoulders and your knees under your hips. You may  place padding under your knees for comfort.  Find your neutral spine and gently tense your abdominal muscles so that you can maintain this position. Your shoulders and hips should form a rectangle that is parallel with the floor and is not twisted.  Keeping your trunk steady, lift your right hand no higher than your shoulder and then your left leg no higher than your hip. Make sure you are not holding your breath. Hold this position __________ seconds.  Continuing to keep your abdominal muscles tense and your back steady, slowly return to your starting position. Repeat with the opposite arm and leg. Repeat __________ times. Complete this exercise __________ times per day.  STRENGTHENING  Lower Abdominals, Double Knee Lift  Lie on a firm bed or floor. Keeping your legs in front of you, bend your knees so they are both pointed toward the ceiling and your feet are flat on the floor.  Tense your abdominal muscles to brace your lower back and slowly lift both of your knees until they come over your hips. Be certain not to hold your breath.  Hold __________ seconds. Using your abdominal muscles, return to the starting position in a slow and controlled manner. Repeat __________ times. Complete this exercise __________ times per day.  POSTURE AND BODY MECHANICS CONSIDERATIONS - Low Back Strain Keeping correct posture when sitting, standing or completing your activities will reduce the stress put on different body tissues, allowing injured tissues a chance to heal and limiting painful experiences. The following are general guidelines for improved posture. Your physician or physical therapist will provide you with any instructions specific to your needs. While reading these guidelines, remember:  The exercises prescribed by your provider will help you have the flexibility and strength to maintain correct postures.  The correct posture provides the best environment for your joints to work. All of your  joints have less wear and tear when properly supported by a spine with good posture. This means you will experience a healthier, less painful body.  Correct posture must be practiced with all of your activities, especially prolonged sitting and standing. Correct posture is as important when doing repetitive low-stress activities (typing) as it is when doing a single heavy-load activity (lifting). RESTING POSITIONS Consider which positions are most painful for you when choosing a resting position. If you have pain with flexion-based activities (sitting, bending, stooping, squatting), choose a position that allows you to rest in a less flexed posture. You would want to avoid curling into a fetal position on your side. If your pain worsens with extension-based activities (prolonged standing, working overhead), avoid resting in an extended position such as sleeping on your stomach. Most people will find more comfort when they rest with their spine in a more neutral position, neither too rounded nor too arched. Lying on a non-sagging bed on your side with a pillow between your knees, or on your back with a pillow under your knees will often provide some relief. Keep in mind, being in any one position for a prolonged period of  time, no matter how correct your posture, can still lead to stiffness. PROPER SITTING POSTURE In order to minimize stress and discomfort on your spine, you must sit with correct posture. Sitting with good posture should be effortless for a healthy body. Returning to good posture is a gradual process. Many people can work toward this most comfortably by using various supports until they have the flexibility and strength to maintain this posture on their own. When sitting with proper posture, your ears will fall over your shoulders and your shoulders will fall over your hips. You should use the back of the chair to support your upper back. Your lower back will be in a neutral position, just  slightly arched. You may place a small pillow or folded towel at the base of your lower back for support.  When working at a desk, create an environment that supports good, upright posture. Without extra support, muscles tire, which leads to excessive strain on joints and other tissues. Keep these recommendations in mind: CHAIR:  A chair should be able to slide under your desk when your back makes contact with the back of the chair. This allows you to work closely.  The chair's height should allow your eyes to be level with the upper part of your monitor and your hands to be slightly lower than your elbows. BODY POSITION  Your feet should make contact with the floor. If this is not possible, use a foot rest.  Keep your ears over your shoulders. This will reduce stress on your neck and lower back. INCORRECT SITTING POSTURES  If you are feeling tired and unable to assume a healthy sitting posture, do not slouch or slump. This puts excessive strain on your back tissues, causing more damage and pain. Healthier options include:  Using more support, like a lumbar pillow.  Switching tasks to something that requires you to be upright or walking.  Talking a brief walk.  Lying down to rest in a neutral-spine position. PROLONGED STANDING WHILE SLIGHTLY LEANING FORWARD  When completing a task that requires you to lean forward while standing in one place for a long time, place either foot up on a stationary 2-4 inch high object to help maintain the best posture. When both feet are on the ground, the lower back tends to lose its slight inward curve. If this curve flattens (or becomes too large), then the back and your other joints will experience too much stress, tire more quickly, and can cause pain. CORRECT STANDING POSTURES Proper standing posture should be assumed with all daily activities, even if they only take a few moments, like when brushing your teeth. As in sitting, your ears should fall over  your shoulders and your shoulders should fall over your hips. You should keep a slight tension in your abdominal muscles to brace your spine. Your tailbone should point down to the ground, not behind your body, resulting in an over-extended swayback posture.  INCORRECT STANDING POSTURES  Common incorrect standing postures include a forward head, locked knees and/or an excessive swayback. WALKING Walk with an upright posture. Your ears, shoulders and hips should all line-up. PROLONGED ACTIVITY IN A FLEXED POSITION When completing a task that requires you to bend forward at your waist or lean over a low surface, try to find a way to stabilize 3 out of 4 of your limbs. You can place a hand or elbow on your thigh or rest a knee on the surface you are reaching across. This will  provide you more stability so that your muscles do not fatigue as quickly. By keeping your knees relaxed, or slightly bent, you will also reduce stress across your lower back. CORRECT LIFTING TECHNIQUES DO :   Assume a wide stance. This will provide you more stability and the opportunity to get as close as possible to the object which you are lifting.  Tense your abdominals to brace your spine. Bend at the knees and hips. Keeping your back locked in a neutral-spine position, lift using your leg muscles. Lift with your legs, keeping your back straight.  Test the weight of unknown objects before attempting to lift them.  Try to keep your elbows locked down at your sides in order get the best strength from your shoulders when carrying an object.  Always ask for help when lifting heavy or awkward objects. INCORRECT LIFTING TECHNIQUES DO NOT:   Lock your knees when lifting, even if it is a small object.  Bend and twist. Pivot at your feet or move your feet when needing to change directions.  Assume that you can safely pick up even a paper clip without proper posture. Document Released: 02/04/2005 Document Revised: 04/29/2011  Document Reviewed: 05/19/2008 [facility] Patient Information 2014 [place], [place].

## 2012-09-10 NOTE — Patient Instructions (Addendum)
advil or alleve over the counter if needed. Heat or ice to area for 15 minutes at a time as needed. Gentle stretches and range of motion - see below.  If not improving in next 4-5 days, or worse sooner - return for recheck.  Return to the clinic or go to the nearest emergency room if any of your symptoms worsen or new symptoms occur. Low Back Strain with Rehab A strain is an injury in which a tendon or muscle is torn. The muscles and tendons of the lower back are vulnerable to strains. However, these muscles and tendons are very strong and require a great force to be injured. Strains are classified into three categories. Grade 1 strains cause pain, but the tendon is not lengthened. Grade 2 strains include a lengthened ligament, due to the ligament being stretched or partially ruptured. With grade 2 strains there is still function, although the function may be decreased. Grade 3 strains involve a complete tear of the tendon or muscle, and function is usually impaired. SYMPTOMS   Pain in the lower back.  Pain that affects one side more than the other.  Pain that gets worse with movement and may be felt in the hip, buttocks, or back of the thigh.  Muscle spasms of the muscles in the back.  Swelling along the muscles of the back.  Loss of strength of the back muscles.  Crackling sound (crepitation) when the muscles are touched. CAUSES  Lower back strains occur when a force is placed on the muscles or tendons that is greater than they can handle. Common causes of injury include:  Prolonged overuse of the muscle-tendon units in the lower back, usually from incorrect posture.  A single violent injury or force applied to the back. RISK INCREASES WITH:  Sports that involve twisting forces on the spine or a lot of bending at the waist (football, rugby, weightlifting, bowling, golf, tennis, speed skating, racquetball, swimming, running, gymnastics, diving).  Poor strength and  flexibility.  Failure to warm up properly before activity.  Family history of lower back pain or disk disorders.  Previous back injury or surgery (especially fusion).  Poor posture with lifting, especially heavy objects.  Prolonged sitting, especially with poor posture. PREVENTION   Learn and use proper posture when sitting or lifting (maintain proper posture when sitting, lift using the knees and legs, not at the waist).  Warm up and stretch properly before activity.  Allow for adequate recovery between workouts.  Maintain physical fitness:  Strength, flexibility, and endurance.  Cardiovascular fitness. PROGNOSIS  If treated properly, lower back strains usually heal within 6 weeks. RELATED COMPLICATIONS   Recurring symptoms, resulting in a chronic problem.  Chronic inflammation, scarring, and partial muscle-tendon tear.  Delayed healing or resolution of symptoms.  Prolonged disability. TREATMENT  Treatment first involves the use of ice and medicine, to reduce pain and inflammation. The use of strengthening and stretching exercises may help reduce pain with activity. These exercises may be performed at home or with a therapist. Severe injuries may require referral to a therapist for further evaluation and treatment, such as ultrasound. Your caregiver may advise that you wear a back brace or corset, to help reduce pain and discomfort. Often, prolonged bed rest results in greater harm then benefit. Corticosteroid injections may be recommended. However, these should be reserved for the most serious cases. It is important to avoid using your back when lifting objects. At night, sleep on your back on a firm mattress with  a pillow placed under your knees. If non-surgical treatment is unsuccessful, surgery may be needed.  MEDICATION   If pain medicine is needed, nonsteroidal anti-inflammatory medicines (aspirin and ibuprofen), or other minor pain relievers (acetaminophen), are often  advised.  Do not take pain medicine for 7 days before surgery.  Prescription pain relievers may be given, if your caregiver thinks they are needed. Use only as directed and only as much as you need.  Ointments applied to the skin may be helpful.  Corticosteroid injections may be given by your caregiver. These injections should be reserved for the most serious cases, because they may only be given a certain number of times. HEAT AND COLD  Cold treatment (icing) should be applied for 10 to 15 minutes every 2 to 3 hours for inflammation and pain, and immediately after activity that aggravates your symptoms. Use ice packs or an ice massage.  Heat treatment may be used before performing stretching and strengthening activities prescribed by your caregiver, physical therapist, or athletic trainer. Use a heat pack or a warm water soak. SEEK MEDICAL CARE IF:   Symptoms get worse or do not improve in 2 to 4 weeks, despite treatment.  You develop numbness, weakness, or loss of bowel or bladder function.  New, unexplained symptoms develop. (Drugs used in treatment may produce side effects.) EXERCISES  RANGE OF MOTION (ROM) AND STRETCHING EXERCISES - Low Back Strain Most people with lower back pain will find that their symptoms get worse with excessive bending forward (flexion) or arching at the lower back (extension). The exercises which will help resolve your symptoms will focus on the opposite motion.  Your physician, physical therapist or athletic trainer will help you determine which exercises will be most helpful to resolve your lower back pain. Do not complete any exercises without first consulting with your caregiver. Discontinue any exercises which make your symptoms worse until you speak to your caregiver.  If you have pain, numbness or tingling which travels down into your buttocks, leg or foot, the goal of the therapy is for these symptoms to move closer to your back and eventually resolve.  Sometimes, these leg symptoms will get better, but your lower back pain may worsen. This is typically an indication of progress in your rehabilitation. Be very alert to any changes in your symptoms and the activities in which you participated in the 24 hours prior to the change. Sharing this information with your caregiver will allow him/her to most efficiently treat your condition.  These exercises may help you when beginning to rehabilitate your injury. Your symptoms may resolve with or without further involvement from your physician, physical therapist or athletic trainer. While completing these exercises, remember:  Restoring tissue flexibility helps normal motion to return to the joints. This allows healthier, less painful movement and activity.  An effective stretch should be held for at least 30 seconds.  A stretch should never be painful. You should only feel a gentle lengthening or release in the stretched tissue. FLEXION RANGE OF MOTION AND STRETCHING EXERCISES: STRETCH  Flexion, Single Knee to Chest   Lie on a firm bed or floor with both legs extended in front of you.  Keeping one leg in contact with the floor, bring your opposite knee to your chest. Hold your leg in place by either grabbing behind your thigh or at your knee.  Pull until you feel a gentle stretch in your lower back. Hold __________ seconds.  Slowly release your grasp and repeat the  exercise with the opposite side. Repeat __________ times. Complete this exercise __________ times per day.  STRETCH  Flexion, Double Knee to Chest   Lie on a firm bed or floor with both legs extended in front of you.  Keeping one leg in contact with the floor, bring your opposite knee to your chest.  Tense your stomach muscles to support your back and then lift your other knee to your chest. Hold your legs in place by either grabbing behind your thighs or at your knees.  Pull both knees toward your chest until you feel a gentle  stretch in your lower back. Hold __________ seconds.  Tense your stomach muscles and slowly return one leg at a time to the floor. Repeat __________ times. Complete this exercise __________ times per day.  STRETCH  Low Trunk Rotation  Lie on a firm bed or floor. Keeping your legs in front of you, bend your knees so they are both pointed toward the ceiling and your feet are flat on the floor.  Extend your arms out to the side. This will stabilize your upper body by keeping your shoulders in contact with the floor.  Gently and slowly drop both knees together to one side until you feel a gentle stretch in your lower back. Hold for __________ seconds.  Tense your stomach muscles to support your lower back as you bring your knees back to the starting position. Repeat the exercise to the other side. Repeat __________ times. Complete this exercise __________ times per day  EXTENSION RANGE OF MOTION AND FLEXIBILITY EXERCISES: STRETCH  Extension, Prone on Elbows   Lie on your stomach on the floor, a bed will be too soft. Place your palms about shoulder width apart and at the height of your head.  Place your elbows under your shoulders. If this is too painful, stack pillows under your chest.  Allow your body to relax so that your hips drop lower and make contact more completely with the floor.  Hold this position for __________ seconds.  Slowly return to lying flat on the floor. Repeat __________ times. Complete this exercise __________ times per day.  RANGE OF MOTION  Extension, Prone Press Ups  Lie on your stomach on the floor, a bed will be too soft. Place your palms about shoulder width apart and at the height of your head.  Keeping your back as relaxed as possible, slowly straighten your elbows while keeping your hips on the floor. You may adjust the placement of your hands to maximize your comfort. As you gain motion, your hands will come more underneath your shoulders.  Hold this  position __________ seconds.  Slowly return to lying flat on the floor. Repeat __________ times. Complete this exercise __________ times per day.  RANGE OF MOTION- Quadruped, Neutral Spine   Assume a hands and knees position on a firm surface. Keep your hands under your shoulders and your knees under your hips. You may place padding under your knees for comfort.  Drop your head and point your tail bone toward the ground below you. This will round out your lower back like an angry cat. Hold this position for __________ seconds.  Slowly lift your head and release your tail bone so that your back sags into a large arch, like an old horse.  Hold this position for __________ seconds.  Repeat this until you feel limber in your lower back.  Now, find your "sweet spot." This will be the most comfortable position somewhere between  the two previous positions. This is your neutral spine. Once you have found this position, tense your stomach muscles to support your lower back.  Hold this position for __________ seconds. Repeat __________ times. Complete this exercise __________ times per day.  STRENGTHENING EXERCISES - Low Back Strain These exercises may help you when beginning to rehabilitate your injury. These exercises should be done near your "sweet spot." This is the neutral, low-back arch, somewhere between fully rounded and fully arched, that is your least painful position. When performed in this safe range of motion, these exercises can be used for people who have either a flexion or extension based injury. These exercises may resolve your symptoms with or without further involvement from your physician, physical therapist or athletic trainer. While completing these exercises, remember:   Muscles can gain both the endurance and the strength needed for everyday activities through controlled exercises.  Complete these exercises as instructed by your physician, physical therapist or athletic  trainer. Increase the resistance and repetitions only as guided.  You may experience muscle soreness or fatigue, but the pain or discomfort you are trying to eliminate should never worsen during these exercises. If this pain does worsen, stop and make certain you are following the directions exactly. If the pain is still present after adjustments, discontinue the exercise until you can discuss the trouble with your caregiver. STRENGTHENING Deep Abdominals, Pelvic Tilt  Lie on a firm bed or floor. Keeping your legs in front of you, bend your knees so they are both pointed toward the ceiling and your feet are flat on the floor.  Tense your lower abdominal muscles to press your lower back into the floor. This motion will rotate your pelvis so that your tail bone is scooping upwards rather than pointing at your feet or into the floor.  With a gentle tension and even breathing, hold this position for __________ seconds. Repeat __________ times. Complete this exercise __________ times per day.  STRENGTHENING  Abdominals, Crunches   Lie on a firm bed or floor. Keeping your legs in front of you, bend your knees so they are both pointed toward the ceiling and your feet are flat on the floor. Cross your arms over your chest.  Slightly tip your chin down without bending your neck.  Tense your abdominals and slowly lift your trunk high enough to just clear your shoulder blades. Lifting higher can put excessive stress on the lower back and does not further strengthen your abdominal muscles.  Control your return to the starting position. Repeat __________ times. Complete this exercise __________ times per day.  STRENGTHENING  Quadruped, Opposite UE/LE Lift   Assume a hands and knees position on a firm surface. Keep your hands under your shoulders and your knees under your hips. You may place padding under your knees for comfort.  Find your neutral spine and gently tense your abdominal muscles so that you  can maintain this position. Your shoulders and hips should form a rectangle that is parallel with the floor and is not twisted.  Keeping your trunk steady, lift your right hand no higher than your shoulder and then your left leg no higher than your hip. Make sure you are not holding your breath. Hold this position __________ seconds.  Continuing to keep your abdominal muscles tense and your back steady, slowly return to your starting position. Repeat with the opposite arm and leg. Repeat __________ times. Complete this exercise __________ times per day.  STRENGTHENING  Lower Abdominals, Double  Knee Lift  Lie on a firm bed or floor. Keeping your legs in front of you, bend your knees so they are both pointed toward the ceiling and your feet are flat on the floor.  Tense your abdominal muscles to brace your lower back and slowly lift both of your knees until they come over your hips. Be certain not to hold your breath.  Hold __________ seconds. Using your abdominal muscles, return to the starting position in a slow and controlled manner. Repeat __________ times. Complete this exercise __________ times per day.  POSTURE AND BODY MECHANICS CONSIDERATIONS - Low Back Strain Keeping correct posture when sitting, standing or completing your activities will reduce the stress put on different body tissues, allowing injured tissues a chance to heal and limiting painful experiences. The following are general guidelines for improved posture. Your physician or physical therapist will provide you with any instructions specific to your needs. While reading these guidelines, remember:  The exercises prescribed by your provider will help you have the flexibility and strength to maintain correct postures.  The correct posture provides the best environment for your joints to work. All of your joints have less wear and tear when properly supported by a spine with good posture. This means you will experience a  healthier, less painful body.  Correct posture must be practiced with all of your activities, especially prolonged sitting and standing. Correct posture is as important when doing repetitive low-stress activities (typing) as it is when doing a single heavy-load activity (lifting). RESTING POSITIONS Consider which positions are most painful for you when choosing a resting position. If you have pain with flexion-based activities (sitting, bending, stooping, squatting), choose a position that allows you to rest in a less flexed posture. You would want to avoid curling into a fetal position on your side. If your pain worsens with extension-based activities (prolonged standing, working overhead), avoid resting in an extended position such as sleeping on your stomach. Most people will find more comfort when they rest with their spine in a more neutral position, neither too rounded nor too arched. Lying on a non-sagging bed on your side with a pillow between your knees, or on your back with a pillow under your knees will often provide some relief. Keep in mind, being in any one position for a prolonged period of time, no matter how correct your posture, can still lead to stiffness. PROPER SITTING POSTURE In order to minimize stress and discomfort on your spine, you must sit with correct posture. Sitting with good posture should be effortless for a healthy body. Returning to good posture is a gradual process. Many people can work toward this most comfortably by using various supports until they have the flexibility and strength to maintain this posture on their own. When sitting with proper posture, your ears will fall over your shoulders and your shoulders will fall over your hips. You should use the back of the chair to support your upper back. Your lower back will be in a neutral position, just slightly arched. You may place a small pillow or folded towel at the base of your lower back for support.  When working  at a desk, create an environment that supports good, upright posture. Without extra support, muscles tire, which leads to excessive strain on joints and other tissues. Keep these recommendations in mind: CHAIR:  A chair should be able to slide under your desk when your back makes contact with the back of the chair. This allows  you to work closely.  The chair's height should allow your eyes to be level with the upper part of your monitor and your hands to be slightly lower than your elbows. BODY POSITION  Your feet should make contact with the floor. If this is not possible, use a foot rest.  Keep your ears over your shoulders. This will reduce stress on your neck and lower back. INCORRECT SITTING POSTURES  If you are feeling tired and unable to assume a healthy sitting posture, do not slouch or slump. This puts excessive strain on your back tissues, causing more damage and pain. Healthier options include:  Using more support, like a lumbar pillow.  Switching tasks to something that requires you to be upright or walking.  Talking a brief walk.  Lying down to rest in a neutral-spine position. PROLONGED STANDING WHILE SLIGHTLY LEANING FORWARD  When completing a task that requires you to lean forward while standing in one place for a long time, place either foot up on a stationary 2-4 inch high object to help maintain the best posture. When both feet are on the ground, the lower back tends to lose its slight inward curve. If this curve flattens (or becomes too large), then the back and your other joints will experience too much stress, tire more quickly, and can cause pain. CORRECT STANDING POSTURES Proper standing posture should be assumed with all daily activities, even if they only take a few moments, like when brushing your teeth. As in sitting, your ears should fall over your shoulders and your shoulders should fall over your hips. You should keep a slight tension in your abdominal muscles  to brace your spine. Your tailbone should point down to the ground, not behind your body, resulting in an over-extended swayback posture.  INCORRECT STANDING POSTURES  Common incorrect standing postures include a forward head, locked knees and/or an excessive swayback. WALKING Walk with an upright posture. Your ears, shoulders and hips should all line-up. PROLONGED ACTIVITY IN A FLEXED POSITION When completing a task that requires you to bend forward at your waist or lean over a low surface, try to find a way to stabilize 3 out of 4 of your limbs. You can place a hand or elbow on your thigh or rest a knee on the surface you are reaching across. This will provide you more stability so that your muscles do not fatigue as quickly. By keeping your knees relaxed, or slightly bent, you will also reduce stress across your lower back. CORRECT LIFTING TECHNIQUES DO :   Assume a wide stance. This will provide you more stability and the opportunity to get as close as possible to the object which you are lifting.  Tense your abdominals to brace your spine. Bend at the knees and hips. Keeping your back locked in a neutral-spine position, lift using your leg muscles. Lift with your legs, keeping your back straight.  Test the weight of unknown objects before attempting to lift them.  Try to keep your elbows locked down at your sides in order get the best strength from your shoulders when carrying an object.  Always ask for help when lifting heavy or awkward objects. INCORRECT LIFTING TECHNIQUES DO NOT:   Lock your knees when lifting, even if it is a small object.  Bend and twist. Pivot at your feet or move your feet when needing to change directions.  Assume that you can safely pick up even a paper clip without proper posture. Document Released: 02/04/2005  Document Revised: 04/29/2011 Document Reviewed: 05/19/2008 [facility] Patient Information 2014 [place], [place].

## 2013-04-19 ENCOUNTER — Ambulatory Visit (INDEPENDENT_AMBULATORY_CARE_PROVIDER_SITE_OTHER): Payer: 59 | Admitting: Internal Medicine

## 2013-04-19 VITALS — BP 108/70 | HR 71 | Temp 98.2°F | Resp 16 | Ht 69.0 in | Wt 162.0 lb

## 2013-04-19 DIAGNOSIS — R300 Dysuria: Secondary | ICD-10-CM

## 2013-04-19 DIAGNOSIS — R319 Hematuria, unspecified: Secondary | ICD-10-CM

## 2013-04-19 DIAGNOSIS — N390 Urinary tract infection, site not specified: Secondary | ICD-10-CM

## 2013-04-19 LAB — POCT URINALYSIS DIPSTICK
BILIRUBIN UA: NEGATIVE
Glucose, UA: NEGATIVE
KETONES UA: NEGATIVE
Leukocytes, UA: NEGATIVE
Nitrite, UA: NEGATIVE
PH UA: 7
PROTEIN UA: 30
Spec Grav, UA: 1.025
Urobilinogen, UA: 0.2

## 2013-04-19 LAB — POCT UA - MICROSCOPIC ONLY
CRYSTALS, UR, HPF, POC: NEGATIVE
Casts, Ur, LPF, POC: NEGATIVE
MUCUS UA: NEGATIVE
YEAST UA: NEGATIVE

## 2013-04-19 MED ORDER — CIPROFLOXACIN HCL 500 MG PO TABS: 500.0000 mg | ORAL_TABLET | Freq: Two times a day (BID) | ORAL | Status: DC

## 2013-04-19 NOTE — Patient Instructions (Signed)
Urinary Tract Infection  Urinary tract infections (UTIs) can develop anywhere along your urinary tract. Your urinary tract is your body's drainage system for removing wastes and extra water. Your urinary tract includes two kidneys, two ureters, a bladder, and a urethra. Your kidneys are a pair of bean-shaped organs. Each kidney is about the size of your fist. They are located below your ribs, one on each side of your spine.  CAUSES  Infections are caused by microbes, which are microscopic organisms, including fungi, viruses, and bacteria. These organisms are so small that they can only be seen through a microscope. Bacteria are the microbes that most commonly cause UTIs.  SYMPTOMS   Symptoms of UTIs may vary by age and gender of the patient and by the location of the infection. Symptoms in young women typically include a frequent and intense urge to urinate and a painful, burning feeling in the bladder or urethra during urination. Older women and men are more likely to be tired, shaky, and weak and have muscle aches and abdominal pain. A fever may mean the infection is in your kidneys. Other symptoms of a kidney infection include pain in your back or sides below the ribs, nausea, and vomiting.  DIAGNOSIS  To diagnose a UTI, your caregiver will ask you about your symptoms. Your caregiver also will ask to provide a urine sample. The urine sample will be tested for bacteria and white blood cells. White blood cells are made by your body to help fight infection.  TREATMENT   Typically, UTIs can be treated with medication. Because most UTIs are caused by a bacterial infection, they usually can be treated with the use of antibiotics. The choice of antibiotic and length of treatment depend on your symptoms and the type of bacteria causing your infection.  HOME CARE INSTRUCTIONS   If you were prescribed antibiotics, take them exactly as your caregiver instructs you. Finish the medication even if you feel better after you  have only taken some of the medication.   Drink enough water and fluids to keep your urine clear or pale yellow.   Avoid caffeine, tea, and carbonated beverages. They tend to irritate your bladder.   Empty your bladder often. Avoid holding urine for long periods of time.   Empty your bladder before and after sexual intercourse.   After a bowel movement, women should cleanse from front to back. Use each tissue only once.  SEEK MEDICAL CARE IF:    You have back pain.   You develop a fever.   Your symptoms do not begin to resolve within 3 days.  SEEK IMMEDIATE MEDICAL CARE IF:    You have severe back pain or lower abdominal pain.   You develop chills.   You have nausea or vomiting.   You have continued burning or discomfort with urination.  MAKE SURE YOU:    Understand these instructions.   Will watch your condition.   Will get help right away if you are not doing well or get worse.  Document Released: 11/14/2004 Document Revised: 08/06/2011 Document Reviewed: 03/15/2011  [employer] Patient Information 2014 [employer].

## 2013-04-19 NOTE — Progress Notes (Signed)
   Subjective:    Patient ID: Tim Patterson, male    DOB: 10-09-1980, 33 y.o.   MRN: [id number]  HPI    Review of Systems     Objective:   Physical Exam        Assessment & Plan:

## 2013-04-19 NOTE — Progress Notes (Signed)
   Subjective:    Patient ID: Tim Patterson, male    DOB: [birth year], 33 y.o.   MRN: [id number]  HPI Pt presents with hematuria that he notices when he urinates every time x 4 days at the end of his stream. No h/o of kidney stones. No back pain. Pt is married. No discharge. No worries about any STD's. No h/o of hematuria Also painful tourinate at end of stream.No phx of UTI. No fever, nausea, or abdominal pain. Review of Systems     Objective:   Physical Exam  Constitutional: He is oriented to person, place, and time. He appears well-developed and well-nourished. No distress.  Eyes: EOM are normal. No scleral icterus.  Neck: Normal range of motion.  Pulmonary/Chest: Effort normal.  Abdominal: Hernia confirmed negative in the right inguinal area and confirmed negative in the left inguinal area.  Genitourinary: Testes normal and penis normal. Uncircumcised. No penile erythema or penile tenderness. No discharge found.  Prostate not tender.  Lymphadenopathy:       Right: No inguinal adenopathy present.       Left: No inguinal adenopathy present.  Neurological: He is alert and oriented to person, place, and time. He exhibits normal muscle tone. Coordination normal.  Skin: No rash noted.  Psychiatric: He has a normal mood and affect.     Results for orders placed in visit on 04/19/13  POCT UA - MICROSCOPIC ONLY      Result Value Ref Range   WBC, Ur, HPF, POC 2-4     RBC, urine, microscopic 25-35     Bacteria, U Microscopic trace     Mucus, UA neg     Epithelial cells, urine per micros 1-3     Crystals, Ur, HPF, POC neg     Casts, Ur, LPF, POC neg     Yeast, UA neg    POCT URINALYSIS DIPSTICK      Result Value Ref Range   Color, UA yellow     Clarity, UA hazy     Glucose, UA neg     Bilirubin, UA neg     Ketones, UA neg     Spec Grav, UA 1.025     Blood, UA large     pH, UA 7.0     Protein, UA 30     Urobilinogen, UA 0.2     Nitrite, UA neg     Leukocytes, UA Negative           Assessment & Plan:  Cipro 500mg  bid UTI care Repeat CCUA in 2-3 weeks

## 2013-04-21 LAB — URINE CULTURE
Colony Count: NO GROWTH
Organism ID, Bacteria: NO GROWTH

## 2013-09-07 ENCOUNTER — Ambulatory Visit (INDEPENDENT_AMBULATORY_CARE_PROVIDER_SITE_OTHER): Payer: 59 | Admitting: Emergency Medicine

## 2013-09-07 VITALS — BP 104/70 | HR 79 | Temp 98.7°F | Resp 16 | Ht 68.5 in | Wt 162.0 lb

## 2013-09-07 DIAGNOSIS — J018 Other acute sinusitis: Secondary | ICD-10-CM

## 2013-09-07 DIAGNOSIS — J029 Acute pharyngitis, unspecified: Secondary | ICD-10-CM

## 2013-09-07 MED ORDER — AMOXICILLIN-POT CLAVULANATE 875-125 MG PO TABS: 1.0000 | ORAL_TABLET | Freq: Two times a day (BID) | ORAL | Status: DC

## 2013-09-07 MED ORDER — PSEUDOEPHEDRINE-GUAIFENESIN ER 60-600 MG PO TB12: 1.0000 | ORAL_TABLET | Freq: Two times a day (BID) | ORAL | Status: AC

## 2013-09-07 NOTE — Patient Instructions (Signed)
Vim xoang (Sinusitis) Vim xoang l hi?n t??ng t?y ??, ?au nh?c v s?ng (vim) ? cc xoang c?nh m?i. Xoang c?nh m?i l cc ti kh trong x??ng m?t (bn d??i m?t, gi?a trn ho?c trn m?t). Trong cc xoang c?nh m?i kh?e m?nh, d?ch nh?y c th? thot ra ngoi v khng kh c th? l?u thng qua chng theo ???ng m?i. Tuy nhin, khi cc xoang c?nh m?i b? vim, d?ch nh?y v khng kh c th? b? m?c k?t. ?i?u ny c th? lm cho vi khu?n v vi trng khc pht tri?n v gy nhi?m trng. Vim xoang c th? pht tri?n m?t cch nhanh chng v ko di trong m?t th?i gian ng?n (c?p tnh) ho?c ti?p t?c trong th?i gian di (m?n tnh). Vim xoang ko di h?n 12 tu?n ???c coi l m?n tnh.  NGUYN NHN Nguyn nhn vim xoang bao g?m:  D? ?ng.  D? d?ng c?u trc, ch?ng h?n nh? d?ch chuy?n v? tr c?a s?n phn cch l? m?i (l?ch vch ng?n), c th? lm gi?m lu?ng khng kh qua m?i c?ng nh? cc xoang v ?nh h??ng ??n kh? n?ng d?n l?u c?a xoang.  B?t th??ng v? ch?c n?ng, ch?ng h?n nh? khi cc s?i lng nh? (lng mao) ph? cc xoang v gip lo?i b? d?ch nh?y khng ho?t ??ng ?ng ho?c khng c. TRI?U CH?NG Cc tri?u ch?ng c?a vim xoang c?p tnh v m?n tnh ??u gi?ng nhau. Cc tri?u ch?ng chnh l ?au v p l?c xung quanh xoang b? ?nh h??ng. Cc tri?u ch?ng khc bao g?m:  ?au r?ng trn.  ?au tai.  ?au ??u.  H?i th? hi.  Suy gi?m thnh gic v v? gic.  Ho n?ng h?n khi n?m.  M?t m?i.  S?t.  R? d?ch ??c t? m?i, th??ng c mu xanh v c th? ch?a m?.  S?ng v ?m h?n ? cc xoang b? ?nh h??ng. CH?N ?ON Chuyn gia ch?m [place] s?c kh?e s? khm th?c th?Rowe Robert qu trnh Jacqulyn Liner, chuyn gia ch?m [place] s?c kh?e c th?:  Soi m?i c?a b?n xem c cc d?u hi?u c?a c?c u b?t th??ng trong l? m?i (polyp m?i) khng.  G vo cc xoang b? ?nh h??ng ?? ki?m tra d?u hi?u nhi?m trng.  Xem bn trong cc xoang (n?i soi) b?ng m?t thi?t b? hnh ?nh ??c bi?t c g?n ?n (n?i soi) ???c ??a vo xoang. N?u chuyn gia ch?m [place] s?c kh?e nghi ng? r?ng  b?n b? vim xoang m?n tnh, m?t ho?c nhi?u xt nghi?m sau ?y c th? ???c khuy?n ngh?:  Xt nghi?m d? ?ng.  L?y m?u c?y ? m?i-M?t m?u d?ch nh?y ???c l?y t? m?i c?a b?n v g?i ??n phng th nghi?m ?? ki?m tra vi khu?n.  T? bo h?c ? m?i-M?t m?u d?ch nh?y ???c l?y t? m?i c?a b?n v xt nghi?m b?i chuyn gia ch?m [place] s?c kh?e ?? xc ??nh xem tnh tr?ng vim xoang c?a b?n c lin quan ??n d? ?ng hay khng. ?I?U TR? H?u h?t cc tr??ng h?p vim xoang c?p tnh c lin quan ??n nhi?m vi rt v s? t? kh?i trong vng 10 ngy. ?i khi thu?c ???c k ??n ?? gip lm gi?m cc tri?u ch?ng (thu?c gi?m ?au, thu?c ch?ng sung huy?t m?i, thu?c x?t m?i steroid ho?c bnh x?t n??c mu?i). Tuy nhin, v?i vim xoang lin quan ??n nhi?m vi khu?n, chuyn gia ch?m [place] s?c kh?e s? k thu?c khng sinh. ?y l nh?ng lo?i thu?c s? gip tiu  di?t vi khu?n gy nhi?m trng. Trong tr??ng h?p hi?m g?p, vim xoang do nhi?m n?m. Trong nh?ng tr??ng h?p ny, chuyn gia ch?m [place] s?c kh?e s? k thu?c khng n?m. M?t s? tr??ng h?p vim xoang m?n tnh s? c?n ph?u thu?t. Ni chung, ?y l nh?ng tr??ng h?p vim xoang ti pht trn 3 l?n m?i n?m, m?c d ? th?c hi?n cc ph??ng php ?i?u tr? khc. H??NG D?N CH?M [place] T?I NH  U?ng th?t nhi?u n??c. N??c gip lm long d?ch nh?y ?? xoang c th? d?n l?u ra d? dng h?n.  S? d?ng my t?o ?m.  Ht h?i n??c 3 ??n 4 l?n m?t ngy (v d?, ng?i trong phng t?m v?i vi sen ?ang ch?y).  ??t kh?n ?m, ?m ln m?t 3 ??n 4 l?n m?t ngy, ho?c theo ch? d?n c?a chuyn gia ch?m [place] s?c kh?e.  S? d?ng bnh x?t m?i ch?a n??c mu?i ?? gip lm ?m v lm s?ch xoang.  Ch? s? d?ng thu?c khng c?n k toa ho?c thu?c c?n k toa ?? gi?m ?au, gi?m c?m gic kh ch?u ho?c h? s?t theo ch? d?n c?a chuyn gia ch?m [place] s?c kh?e c?a b?n. HY NGAY L?P T?C ?I KHM N?U:  B?n b? ?au gia t?ng ho?c ?au ??u n?ng.  B?n b? bu?n nn, nn m?a ho?c bu?n ng?.  B?n b? s?ng xung quanh m?t.  B?n c v?n ?? v? th? l?c.  B?n b? c?ng  c?.  B?n b? kh th?. ??M B?O B?N:  Hi?u cc h??ng d?n ny.  S? theo di tnh tr?ng c?a mnh.  S? yu c?u tr? gip ngay l?p t?c n?u b?n c?m th?y khng ?? ho?c tnh tr?ng tr?m tr?ng h?n. Document Released: 08/06/2011 Document Revised: 10/07/2012 [facility] Patient Information 2015 [place]. This information is not intended to replace advice given to you by your health care provider. Make sure you discuss any questions you have with your health care provider.

## 2013-09-07 NOTE — Progress Notes (Signed)
[facility] [address], [place] [id number] [phone]  Date:  09/07/2013   Name:  Tim MylarYphan Patterson   DOB:  June 12, 1980   MRN:  [id number]  PCP:  No PCP Per Patient    Chief Complaint: Headache and Sore Throat   History of Present Illness:  Tim Patterson is a 33 y.o. very pleasant male patient who presents with the following:  Awoke yesterday with pain in posterior neck and sore throat.  No cough or wheezing or shortness of breath.  Has post nasal drainage that is mucopurulent and has been blood tinged.. No fever or chills. No nausea or vomiting.  No history of injury or neuro or visual symptoms.  Previous history of hemoptysis.  No improvement with over the counter medications or other home remedies. Denies other complaint or health concern today.   Patient Active Problem List   Diagnosis Date Noted  . Cough 06/05/2012    History reviewed. No pertinent past medical history.  History reviewed. No pertinent past surgical history.  History  Substance Use Topics  . Smoking status: Never Smoker   . Smokeless tobacco: Never Used  . Alcohol Use: No    History reviewed. No pertinent family history.  No Known Allergies  Medication list has been reviewed and updated.  No current outpatient prescriptions on file prior to visit.   No current facility-administered medications on file prior to visit.    Review of Systems:  As per HPI, otherwise negative.    Physical Examination: Filed Vitals:   09/07/13 1921  BP: 104/70  Pulse: 79  Temp: 98.7 F (37.1 C)  Resp: 16   Filed Vitals:   09/07/13 1921  Height: 5' 8.5" (1.74 m)  Weight: 162 lb (73.483 kg)   Body mass index is 24.27 kg/(m^2). Ideal Body Weight: Weight in (lb) to have BMI = 25: 166.5  GEN: WDWN, NAD, Non-toxic, A & O x 3 HEENT: Atraumatic, Normocephalic. Neck supple. No masses, No LAD. Ears and Nose: No external deformity.  Purulent nasal drainage. CV: RRR, No M/G/R. No JVD. No  thrill. No extra heart sounds. PULM: CTA B, no wheezes, crackles, rhonchi. No retractions. No resp. distress. No accessory muscle use. ABD: S, NT, ND, +BS. No rebound. No HSM. EXTR: No c/c/e NEURO Normal gait.  PSYCH: Normally interactive. Conversant. Not depressed or anxious appearing.  Calm demeanor.    Assessment and Plan: Sinusitis Pharyngitis augmentin mucinex d  Signed,  Phillips OdorJeffery Reniah Cottingham, MD

## 2014-03-07 ENCOUNTER — Ambulatory Visit (INDEPENDENT_AMBULATORY_CARE_PROVIDER_SITE_OTHER): Payer: BLUE CROSS/BLUE SHIELD | Admitting: Internal Medicine

## 2014-03-07 VITALS — BP 116/74 | HR 79 | Temp 98.3°F | Resp 18 | Ht 69.5 in | Wt 165.0 lb

## 2014-03-07 DIAGNOSIS — R51 Headache: Secondary | ICD-10-CM

## 2014-03-07 DIAGNOSIS — R519 Headache, unspecified: Secondary | ICD-10-CM

## 2014-03-07 DIAGNOSIS — R319 Hematuria, unspecified: Secondary | ICD-10-CM

## 2014-03-07 LAB — POCT URINALYSIS DIPSTICK
BILIRUBIN UA: NEGATIVE
GLUCOSE UA: NEGATIVE
Ketones, UA: NEGATIVE
Leukocytes, UA: NEGATIVE
NITRITE UA: NEGATIVE
PH UA: 6
Protein, UA: NEGATIVE
RBC UA: NEGATIVE
Spec Grav, UA: 1.02
UROBILINOGEN UA: 0.2

## 2014-03-07 LAB — POCT UA - MICROSCOPIC ONLY
Bacteria, U Microscopic: NEGATIVE
CRYSTALS, UR, HPF, POC: NEGATIVE
Casts, Ur, LPF, POC: NEGATIVE
MUCUS UA: NEGATIVE
Yeast, UA: NEGATIVE

## 2014-03-07 LAB — COMPREHENSIVE METABOLIC PANEL
ALBUMIN: 4.5 g/dL (ref 3.5–5.2)
ALK PHOS: 43 U/L (ref 39–117)
ALT: 13 U/L (ref 0–53)
AST: 15 U/L (ref 0–37)
BILIRUBIN TOTAL: 0.6 mg/dL (ref 0.2–1.2)
BUN: 18 mg/dL (ref 6–23)
CALCIUM: 9.6 mg/dL (ref 8.4–10.5)
CHLORIDE: 104 meq/L (ref 96–112)
CO2: 29 mEq/L (ref 19–32)
Creat: 0.88 mg/dL (ref 0.50–1.35)
GLUCOSE: 83 mg/dL (ref 70–99)
POTASSIUM: 4 meq/L (ref 3.5–5.3)
Sodium: 141 mEq/L (ref 135–145)
Total Protein: 7.6 g/dL (ref 6.0–8.3)

## 2014-03-07 LAB — POCT SEDIMENTATION RATE: POCT SED RATE: 6 mm/h (ref 0–22)

## 2014-03-07 LAB — POCT CBC
Granulocyte percent: 57 %G (ref 37–80)
HCT, POC: 50.4 % (ref 43.5–53.7)
Hemoglobin: 16.3 g/dL (ref 14.1–18.1)
LYMPH, POC: 1.9 (ref 0.6–3.4)
MCH, POC: 27.6 pg (ref 27–31.2)
MCHC: 32.4 g/dL (ref 31.8–35.4)
MCV: 85.4 fL (ref 80–97)
MID (CBC): 0.3 (ref 0–0.9)
MPV: 7.5 fL (ref 0–99.8)
POC GRANULOCYTE: 3 (ref 2–6.9)
POC LYMPH %: 36.4 % (ref 10–50)
POC MID %: 6.6 % (ref 0–12)
Platelet Count, POC: 173 10*3/uL (ref 142–424)
RBC: 5.9 M/uL (ref 4.69–6.13)
RDW, POC: 14.5 %
WBC: 5.2 10*3/uL (ref 4.6–10.2)

## 2014-03-07 MED ORDER — METHOCARBAMOL 750 MG PO TABS: 750.0000 mg | ORAL_TABLET | Freq: Four times a day (QID) | ORAL | Status: DC

## 2014-03-07 MED ORDER — IBUPROFEN 600 MG PO TABS: 600.0000 mg | ORAL_TABLET | Freq: Three times a day (TID) | ORAL | Status: AC | PRN

## 2014-03-07 NOTE — Patient Instructions (Signed)

## 2014-03-07 NOTE — Progress Notes (Signed)
Subjective:    Patient ID: Tim Patterson, male    DOB: 1980/08/07, 34 y.o.   MRN: [id number]  HPI HA with neck pain for one day, has HA like this in past. No weakness, numbness, sensory loss, or function loss. No clear family hx of HA but definite cultural and language barrier. Needs note for work.    Review of Systems Had significant hematuria in March, no follow up. Had serious cough and saw pulmonary in 2014. Cough has resolved    Objective:   Physical Exam  Constitutional: He is oriented to person, place, and time. He appears well-developed and well-nourished. He appears distressed.  HENT:  Head: Normocephalic.  Right Ear: External ear normal.  Left Ear: External ear normal.  Nose: Nose normal.  Mouth/Throat: Oropharynx is clear and moist.  Eyes: Conjunctivae and EOM are normal. Pupils are equal, round, and reactive to light.  Neck: Normal range of motion. No thyromegaly present.  Cardiovascular: Normal rate, regular rhythm and normal heart sounds.   Pulmonary/Chest: Effort normal and breath sounds normal.  Abdominal: Soft. He exhibits no mass. There is no tenderness.  Musculoskeletal: Normal range of motion. He exhibits no tenderness.  Lymphadenopathy:    He has no cervical adenopathy.  Neurological: He is alert and oriented to person, place, and time. He has normal strength and normal reflexes. No cranial nerve deficit. He exhibits normal muscle tone. He displays a negative Romberg sign. Coordination normal. He displays no Babinski's sign on the right side. He displays no Babinski's sign on the left side.  Balance, Finger-nose, Drift all perfect  Neck is fully supple with negative brudzinski/kernig  Psychiatric: He has a normal mood and affect. His behavior is normal. Judgment and thought content normal.   Results for orders placed or performed in visit on 03/07/14  POCT urinalysis dipstick  Result Value Ref Range   Color, UA yellow    Clarity, UA clear    Glucose, UA  neg    Bilirubin, UA neg    Ketones, UA neg    Spec Grav, UA 1.020    Blood, UA neg    pH, UA 6.0    Protein, UA neg    Urobilinogen, UA 0.2    Nitrite, UA neg    Leukocytes, UA Negative   POCT UA - Microscopic Only  Result Value Ref Range   WBC, Ur, HPF, POC 0-2    RBC, urine, microscopic 0-1    Bacteria, U Microscopic neg    Mucus, UA neg    Epithelial cells, urine per micros 0-1    Crystals, Ur, HPF, POC neg    Casts, Ur, LPF, POC neg    Yeast, UA neg   POCT CBC  Result Value Ref Range   WBC 5.2 4.6 - 10.2 K/uL   Lymph, poc 1.9 0.6 - 3.4   POC LYMPH PERCENT 36.4 10 - 50 %L   MID (cbc) 0.3 0 - 0.9   POC MID % 6.6 0 - 12 %M   POC Granulocyte 3.0 2 - 6.9   Granulocyte percent 57.0 37 - 80 %G   RBC 5.90 4.69 - 6.13 M/uL   Hemoglobin 16.3 14.1 - 18.1 g/dL   HCT, POC 40.9 81.1 - 53.7 %   MCV 85.4 80 - 97 fL   MCH, POC 27.6 27 - 31.2 pg   MCHC 32.4 31.8 - 35.4 g/dL   RDW, POC 91.4 %   Platelet Count, POC 173 142 - 424 K/uL  MPV 7.5 0 - 99.8 fL          Assessment & Plan:  Probable migrain Hematuria resolved Must f/up if HA persists and will CT

## 2014-03-08 ENCOUNTER — Encounter: Payer: Self-pay | Admitting: Family Medicine

## 2014-09-28 IMAGING — CR DG CHEST 2V
4 series · 4 of 4 positions shown · non-contrast
Comparison: Two-view chest 03/24/2003

CLINICAL DATA: Cough.  Question aspiration.

CHEST - 2 VIEW

[PA (1 of 2)]
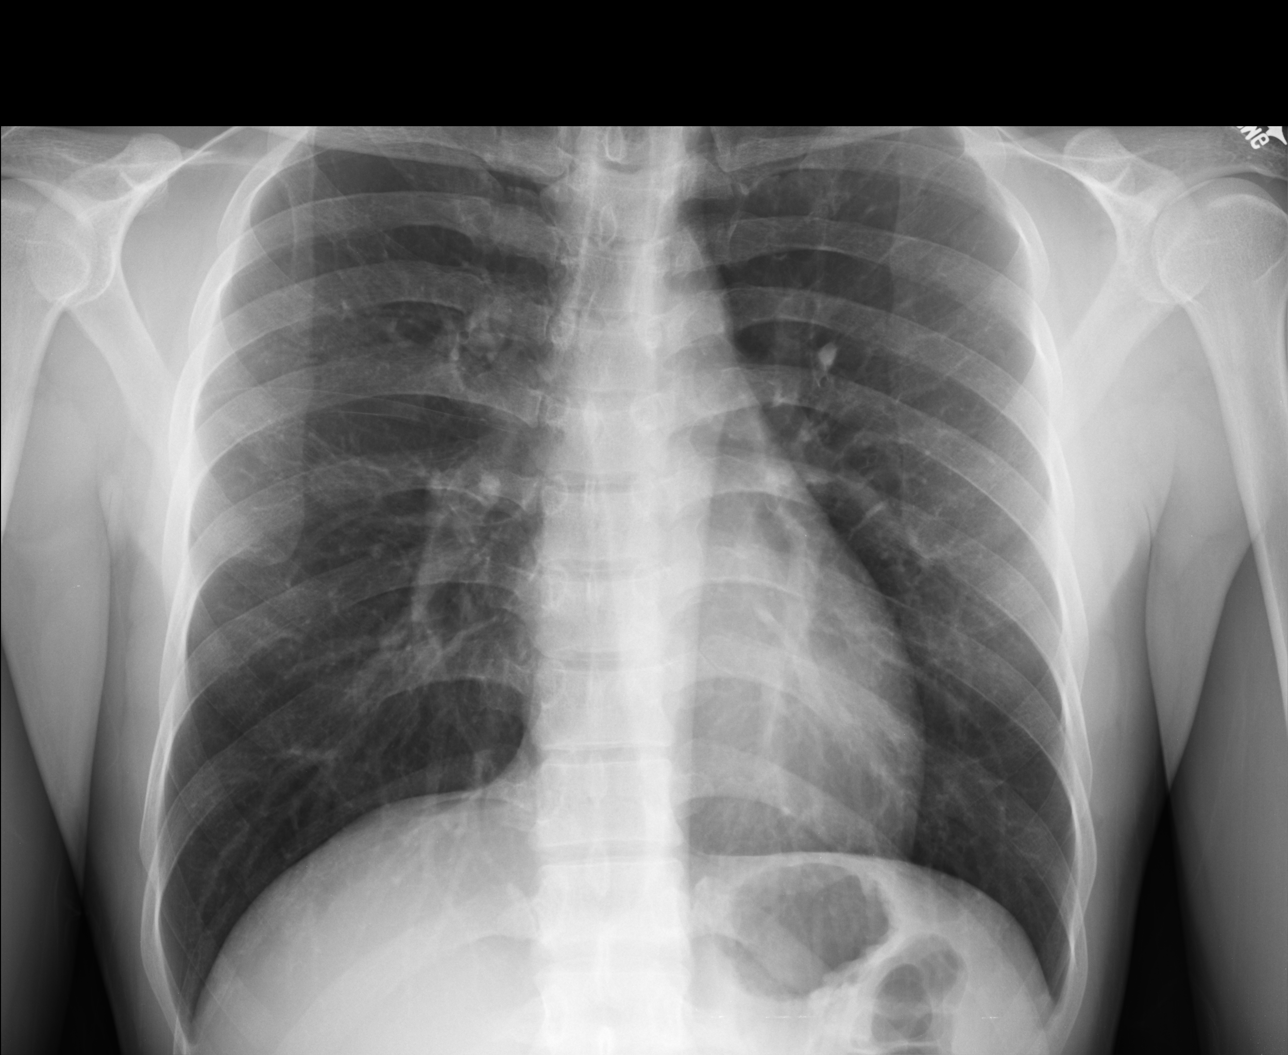

[lateral (1 of 2)]
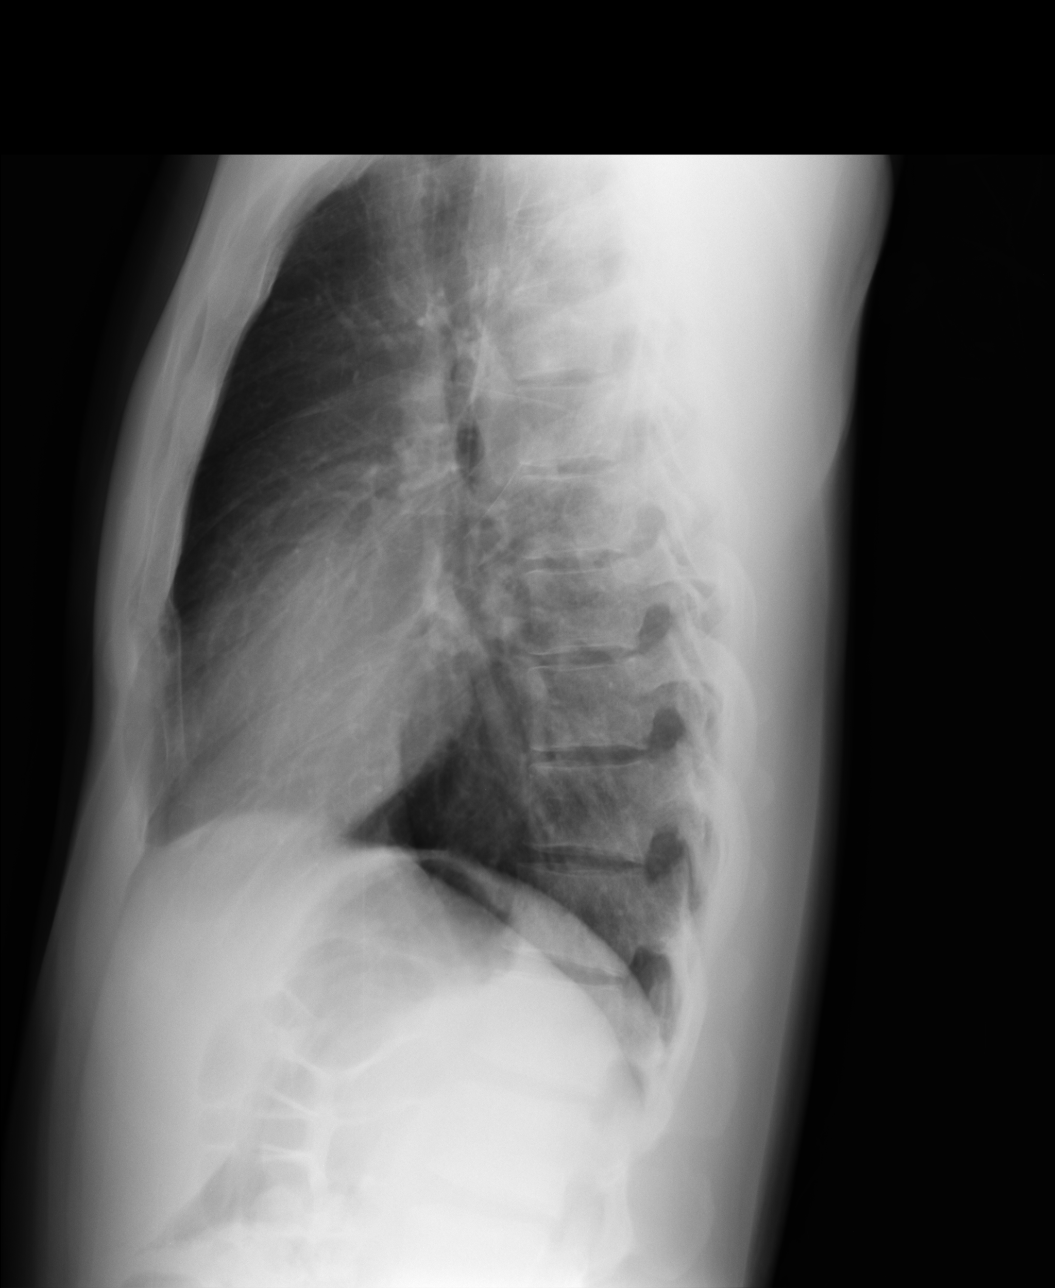

[PA (2 of 2)]
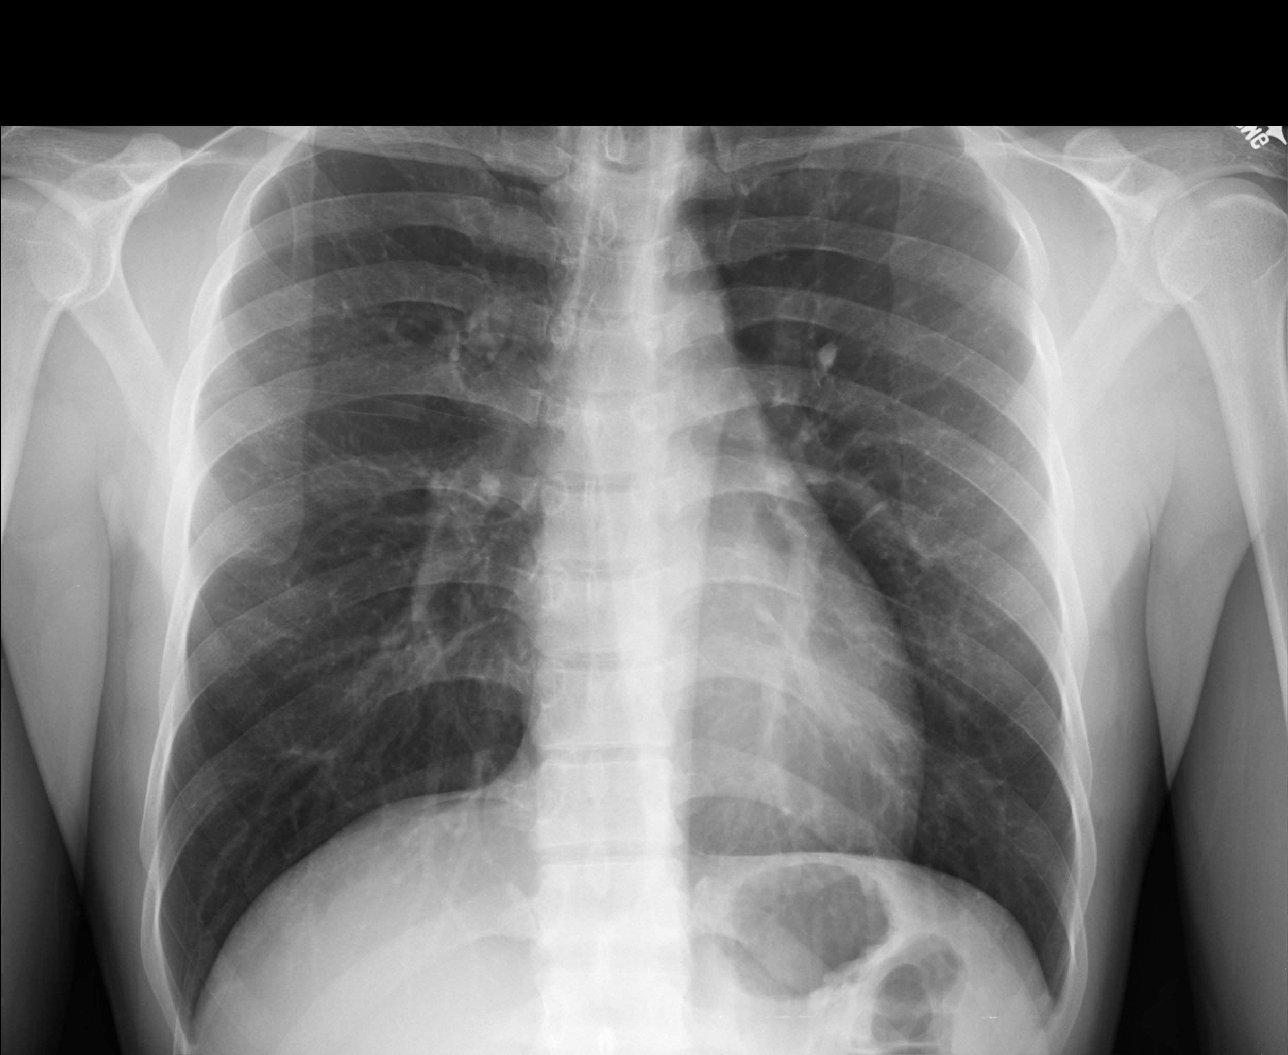

[lateral (2 of 2)]
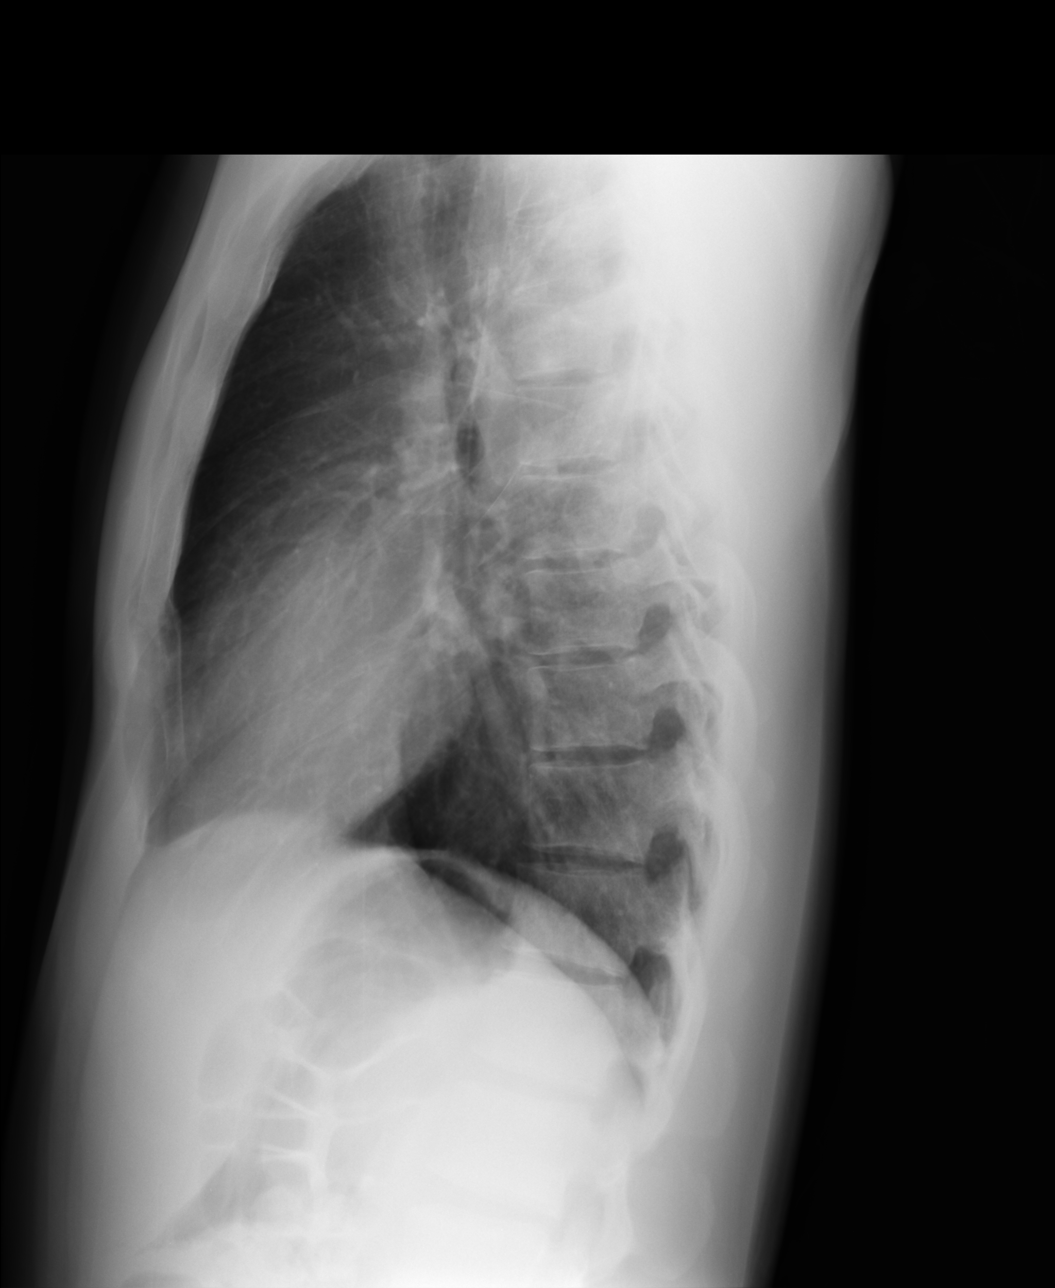

[4 of 4 positions shown; findings below may reference images not displayed]

FINDINGS: The heart size is normal.  The lungs are clear.  The
visualized soft tissues and bony thorax are unremarkable.
IMPRESSION: Negative chest.

## 2014-12-24 ENCOUNTER — Ambulatory Visit (INDEPENDENT_AMBULATORY_CARE_PROVIDER_SITE_OTHER): Payer: BLUE CROSS/BLUE SHIELD | Admitting: Family Medicine

## 2014-12-24 VITALS — BP 120/78 | HR 107 | Temp 98.9°F | Resp 18 | Ht 69.0 in | Wt 165.4 lb

## 2014-12-24 DIAGNOSIS — J020 Streptococcal pharyngitis: Secondary | ICD-10-CM | POA: Diagnosis not present

## 2014-12-24 DIAGNOSIS — J029 Acute pharyngitis, unspecified: Secondary | ICD-10-CM | POA: Diagnosis not present

## 2014-12-24 LAB — POCT RAPID STREP A (OFFICE): Rapid Strep A Screen: POSITIVE — AB

## 2014-12-24 MED ORDER — PENICILLIN V POTASSIUM 500 MG PO TABS: 500.0000 mg | ORAL_TABLET | Freq: Three times a day (TID) | ORAL | Status: DC

## 2014-12-24 NOTE — Patient Instructions (Addendum)
Drink plenty of fluids and get enough rest  Avoid getting in the face of your family or sharing things with them that might communicate oral secretions  Tylenol or ibuprofen for aching and fever  Take penicillin 500 mg as directed for full 10 days  Return if further problems  Strep Throat Strep throat is a bacterial infection of the throat. Your health care provider may call the infection tonsillitis or pharyngitis, depending on whether there is swelling in the tonsils or at the back of the throat. Strep throat is most common during the cold months of the year in children who are 91-53 years of age, but it can happen during any season in people of any age. This infection is spread from person to person (contagious) through coughing, sneezing, or close contact. CAUSES Strep throat is caused by the bacteria called Streptococcus pyogenes. RISK FACTORS This condition is more likely to develop in:  People who spend time in crowded places where the infection can spread easily.  People who have close contact with someone who has strep throat. SYMPTOMS Symptoms of this condition include:  Fever or chills.   Redness, swelling, or pain in the tonsils or throat.  Pain or difficulty when swallowing.  White or yellow spots on the tonsils or throat.  Swollen, tender glands in the neck or under the jaw.  Red rash all over the body (rare). DIAGNOSIS This condition is diagnosed by performing a rapid strep test or by taking a swab of your throat (throat culture test). Results from a rapid strep test are usually ready in a few minutes, but throat culture test results are available after one or two days. TREATMENT This condition is treated with antibiotic medicine. HOME CARE INSTRUCTIONS Medicines  Take over-the-counter and prescription medicines only as told by your health care provider.  Take your antibiotic as told by your health care provider. Do not stop taking the antibiotic even if you  start to feel better.  Have family members who also have a sore throat or fever tested for strep throat. They may need antibiotics if they have the strep infection. Eating and Drinking  Do not share food, drinking cups, or personal items that could cause the infection to spread to other people.  If swallowing is difficult, try eating soft foods until your sore throat feels better.  Drink enough fluid to keep your urine clear or pale yellow. General Instructions  Gargle with a salt-water mixture 3-4 times per day or as needed. To make a salt-water mixture, completely dissolve -1 tsp of salt in 1 cup of warm water.  Make sure that all household members wash their hands well.  Get plenty of rest.  Stay home from school or work until you have been taking antibiotics for 24 hours.  Keep all follow-up visits as told by your health care provider. This is important. SEEK MEDICAL CARE IF:  The glands in your neck continue to get bigger.  You develop a rash, cough, or earache.  You cough up a thick liquid that is green, yellow-brown, or bloody.  You have pain or discomfort that does not get better with medicine.  Your problems seem to be getting worse rather than better.  You have a fever. SEEK IMMEDIATE MEDICAL CARE IF:  You have new symptoms, such as vomiting, severe headache, stiff or painful neck, chest pain, or shortness of breath.  You have severe throat pain, drooling, or changes in your voice.  You have swelling of the  neck, or the skin on the neck becomes red and tender.  You have signs of dehydration, such as fatigue, dry mouth, and decreased urination.  You become increasingly sleepy, or you cannot wake up completely.  Your joints become red or painful.   This information is not intended to replace advice given to you by your health care provider. Make sure you discuss any questions you have with your health care provider.   Document Released: 02/02/2000 Document  Revised: 10/26/2014 Document Reviewed: 05/30/2014 Elsevier Interactive Patient Education [employer]

## 2014-12-24 NOTE — Progress Notes (Signed)
Patient ID: Tim Patterson, male    DOB: 28-Jan-1981  Age: 34 y.o. MRN: [id number]  Chief Complaint  Patient presents with  . Sore Throat    started thursday  . Generalized Body Aches    Subjective:   34 year old man with a bad sore throat that started a couple of days ago. He has had body aches. Not much headache. He does have some swelling and tenderness in the front of his neck also.  He is generally a healthy man. Works regularly. He is married to a Engineer, sitemedical assistant, and they are expecting their second child in the next month.    Current allergies, medications, problem list, past/family and social histories reviewed.  Objective:  BP 120/78 mmHg  Pulse 107  Temp(Src) 98.9 F (37.2 C) (Oral)  Resp 18  Ht 5\' 9"  (1.753 m)  Wt 165 lb 6.4 oz (75.025 kg)  BMI 24.41 kg/m2  SpO2 99%  He looks like he doesn't feel well. His throat is erythematous with some exudate on the tonsils which are swollen. No other lesions noted in the mouth. Neck was supple with anterior cervical nodes, medium size. Chest clear to auscultation. Heart regular without murmur. Afebrile at this time.  Assessment & Plan:   Assessment: No diagnosis found.    Plan: Strep test and culture if needed  Results for orders placed or performed in visit on 12/24/14  POCT rapid strep A  Result Value Ref Range   Rapid Strep A Screen Positive (A) Negative     No orders of the defined types were placed in this encounter.    No orders of the defined types were placed in this encounter.         There are no Patient Instructions on file for this visit.   No Follow-up on file.   Jalise Zawistowski, MD 12/24/2014

## 2014-12-26 ENCOUNTER — Telehealth: Payer: Self-pay

## 2014-12-26 ENCOUNTER — Ambulatory Visit (INDEPENDENT_AMBULATORY_CARE_PROVIDER_SITE_OTHER): Payer: BLUE CROSS/BLUE SHIELD | Admitting: Internal Medicine

## 2014-12-26 VITALS — BP 118/72 | HR 75 | Temp 97.6°F | Resp 18 | Ht 69.0 in | Wt 166.6 lb

## 2014-12-26 DIAGNOSIS — J020 Streptococcal pharyngitis: Secondary | ICD-10-CM

## 2014-12-26 DIAGNOSIS — R509 Fever, unspecified: Secondary | ICD-10-CM

## 2014-12-26 DIAGNOSIS — R42 Dizziness and giddiness: Secondary | ICD-10-CM

## 2014-12-26 LAB — POCT CBC
GRANULOCYTE PERCENT: 50.1 % (ref 37–80)
HEMATOCRIT: 47.9 % (ref 43.5–53.7)
Hemoglobin: 15.8 g/dL (ref 14.1–18.1)
LYMPH, POC: 2.3 (ref 0.6–3.4)
MCH, POC: 27.4 pg (ref 27–31.2)
MCHC: 33.1 g/dL (ref 31.8–35.4)
MCV: 82.7 fL (ref 80–97)
MID (cbc): 0.3 (ref 0–0.9)
MPV: 7.1 fL (ref 0–99.8)
POC GRANULOCYTE: 2.6 (ref 2–6.9)
POC LYMPH %: 44.4 % (ref 10–50)
POC MID %: 5.5 % (ref 0–12)
Platelet Count, POC: 202 10*3/uL (ref 142–424)
RBC: 5.79 M/uL (ref 4.69–6.13)
RDW, POC: 13.7 %
WBC: 5.1 10*3/uL (ref 4.6–10.2)

## 2014-12-26 MED ORDER — PENICILLIN G BENZATHINE 1200000 UNIT/2ML IM SUSP
1.2000 10*6.[IU] | Freq: Once | INTRAMUSCULAR | Status: AC
  Administered 2014-12-26: 1.2 10*6.[IU] via INTRAMUSCULAR

## 2014-12-26 NOTE — Progress Notes (Signed)
Subjective:  This chart was scribed for Ellamae Sia, MD by Stann Ore, Medical Scribe. This patient was seen in Room 11 and the patient's care was started at 5:27 PM.    Patient ID: Tim Patterson, male    DOB: 17-May-1980, 34 y.o.   MRN: [id number] Chief Complaint  Patient presents with  . Dizziness    started this morning   . Generalized Body Aches    started this morning, was seen on Saturday for strep throat     HPI Tim Patterson is a 34 y.o. male who presents to [facility] complaining of dizziness with general myalgia that was noticed this morning.  Pt was seen in office 2 days ago by Dr. Alwyn Ren for strep throat. He was given penicillin for the strep throat, but it causes his stomach to hurt. His throat is a lot better now. He has some coughs. He denies fever, vomit, appetite changes. He notices the dizziness more when he stands up after sitting down. He denies sick contact at home.   Patient Active Problem List   Diagnosis Date Noted  . Cough 06/05/2012    Current outpatient prescriptions:  .  ibuprofen (ADVIL,MOTRIN) 600 MG tablet, Take 1 tablet (600 mg total) by mouth every 8 (eight) hours as needed., Disp: 30 tablet, Rfl: 0 .  amoxicillin-clavulanate (AUGMENTIN) 875-125 MG per tablet, Take 1 tablet by mouth 2 (two) times daily. (Patient not taking: Reported on 12/24/2014), Disp: 20 tablet, Rfl: 0 .  methocarbamol (ROBAXIN-750) 750 MG tablet, Take 1 tablet (750 mg total) by mouth 4 (four) times daily. (Patient not taking: Reported on 12/24/2014), Disp: 40 tablet, Rfl: 1 .  penicillin v potassium (VEETID) 500 MG tablet, Take 1 tablet (500 mg total) by mouth 3 (three) times daily. (Patient not taking: Reported on 12/26/2014), Disp: 30 tablet, Rfl: 0  Review of Systems  Constitutional: Negative for fever, activity change and appetite change.  HENT: Positive for sore throat.   Gastrointestinal: Negative for nausea, vomiting, abdominal pain, diarrhea and constipation.  Musculoskeletal:  Positive for myalgias (general).  Neurological: Positive for dizziness. Negative for light-headedness, numbness and headaches.       Objective:   Physical Exam  Constitutional: He is oriented to person, place, and time. He appears well-developed and well-nourished. No distress.  HENT:  Head: Normocephalic and atraumatic.  Right Ear: Tympanic membrane and external ear normal.  Left Ear: Tympanic membrane and external ear normal.  Nose: Nose normal.  TM clear, nose clear, throat red without exudate, no lymphadenopathy   Eyes: EOM are normal. Pupils are equal, round, and reactive to light.  Neck: Neck supple.  Cardiovascular: Normal rate and regular rhythm.   No murmur heard. Pulmonary/Chest: Effort normal and breath sounds normal. No respiratory distress. He has no wheezes.  Musculoskeletal: Normal range of motion.  Lymphadenopathy:    He has no cervical adenopathy.  Neurological: He is alert and oriented to person, place, and time.  Skin: Skin is warm and dry.  Psychiatric: He has a normal mood and affect. His behavior is normal.  Nursing note and vitals reviewed.   BP 118/72 mmHg  Pulse 75  Temp(Src) 97.6 F (36.4 C) (Oral)  Resp 18  Ht  (1.753 m)  Wt 166 lb 9.6 oz (75.569 kg)  BMI 24.59 kg/m2  SpO2 98%  Results for orders placed or performed in visit on 12/26/14  POCT CBC  Result Value Ref Range   WBC 5.1 4.6 - 10.2 K/uL   Lymph,  poc 2.3 0.6 - 3.4   POC LYMPH PERCENT 44.4 10 - 50 %L   MID (cbc) 0.3 0 - 0.9   POC MID % 5.5 0 - 12 %M   POC Granulocyte 2.6 2 - 6.9   Granulocyte percent 50.1 37 - 80 %G   RBC 5.79 4.69 - 6.13 M/uL   Hemoglobin 15.8 14.1 - 18.1 g/dL   HCT, POC 16.147.9 09.643.5 - 53.7 %   MCV 82.7 80 - 97 fL   MCH, POC 27.4 27 - 31.2 pg   MCHC 33.1 31.8 - 35.4 g/dL   RDW, POC 04.513.7 %   Platelet Count, POC 202 142 - 424 K/uL   MPV 7.1 0 - 99.8 fL        Assessment & Plan:  Fever, unspecified fever cause - Plan: POCT  CBC  Lightheaded  Streptococcal sore throat - Plan: penicillin g benzathine (BICILLIN LA) 1200000 UNIT/2ML injection 1.2 Million Units  Stop oral meds and give bicillin Push fluids  oow 2 more days   By signing my name below, I, Stann Oresung-Kai Tsai, attest that this documentation has been prepared under the direction and in the presence of Ellamae Siaobert Nivaan Dicenzo, MD. Electronically Signed: Stann Oresung-Kai Tsai, Scribe. 12/26/2014 , 5:27 PM .  I have completed the patient encounter in its entirety as documented by the scribe, with editing by me where necessary. Tatsuya Okray P. Merla Richesoolittle, M.D.

## 2014-12-26 NOTE — Telephone Encounter (Signed)
Pt was seen by dr hopper on Saturday and diagnosed with strep and the medication is causing lower abdomen pain -wife wants to know if meds can be changed or what to do next   Best number [phone]

## 2014-12-27 NOTE — Telephone Encounter (Signed)
amoxicillin-clavulanate (AUGMENTIN) 875-125 MG per tablet [[id number]][[id number]]   Pt came in to be seen.

## 2016-12-27 ENCOUNTER — Ambulatory Visit: Payer: BLUE CROSS/BLUE SHIELD | Admitting: Emergency Medicine

## 2016-12-27 ENCOUNTER — Other Ambulatory Visit: Payer: Self-pay

## 2016-12-27 ENCOUNTER — Encounter: Payer: Self-pay | Admitting: Emergency Medicine

## 2016-12-27 VITALS — BP 119/79 | HR 94 | Temp 98.4°F | Resp 18 | Ht 69.0 in | Wt 177.0 lb

## 2016-12-27 DIAGNOSIS — R399 Unspecified symptoms and signs involving the genitourinary system: Secondary | ICD-10-CM | POA: Insufficient documentation

## 2016-12-27 DIAGNOSIS — R319 Hematuria, unspecified: Secondary | ICD-10-CM | POA: Diagnosis not present

## 2016-12-27 LAB — POCT URINALYSIS DIP (MANUAL ENTRY)
BILIRUBIN UA: NEGATIVE
BILIRUBIN UA: NEGATIVE mg/dL
Glucose, UA: NEGATIVE mg/dL
Nitrite, UA: NEGATIVE
Protein Ur, POC: NEGATIVE mg/dL
SPEC GRAV UA: 1.025 (ref 1.010–1.025)
UROBILINOGEN UA: 0.2 U/dL
pH, UA: 7 (ref 5.0–8.0)

## 2016-12-27 MED ORDER — CIPROFLOXACIN HCL 500 MG PO TABS: 500.0000 mg | ORAL_TABLET | Freq: Two times a day (BID) | ORAL | 0 refills | Status: AC

## 2016-12-27 NOTE — Patient Instructions (Addendum)
     IF you received an x-ray today, you will receive an invoice from [facility]. Please contact [facility] at [phone] with questions or concerns regarding your invoice.   IF you received labwork today, you will receive an invoice from [facility]. Please contact [facility] at [phone] with questions or concerns regarding your invoice.   Our billing staff will not be able to assist you with questions regarding bills from these companies.  You will be contacted with the lab results as soon as they are available. The fastest way to get your results is to activate your My Chart account. Instructions are located on the last page of this paperwork. If you have not heard from us regarding the results in 2 weeks, please contact this office.     Urinary Tract Infection, Adult A urinary tract infection (UTI) is an infection of any part of the urinary tract. The urinary tract includes the:  Kidneys.  Ureters.  Bladder.  Urethra.  These organs make, store, and get rid of pee (urine) in the body. Follow these instructions at home:  Take over-the-counter and prescription medicines only as told by your doctor.  If you were prescribed an antibiotic medicine, take it as told by your doctor. Do not stop taking the antibiotic even if you start to feel better.  Avoid the following drinks: ? Alcohol. ? Caffeine. ? Tea. ? Carbonated drinks.  Drink enough fluid to keep your pee clear or pale yellow.  Keep all follow-up visits as told by your doctor. This is important.  Make sure to: ? Empty your bladder often and completely. Do not to hold pee for long periods of time. ? Empty your bladder before and after sex. ? Wipe from front to back after a bowel movement if you are male. Use each tissue one time when you wipe. Contact a doctor if:  You have back pain.  You have a fever.  You feel sick to your stomach (nauseous).  You throw up (vomit).  Your symptoms do  not get better after 3 days.  Your symptoms go away and then come back. Get help right away if:  You have very bad back pain.  You have very bad lower belly (abdominal) pain.  You are throwing up and cannot keep down any medicines or water. This information is not intended to replace advice given to you by your health care provider. Make sure you discuss any questions you have with your health care provider. Document Released: 07/24/2007 Document Revised: 07/13/2015 Document Reviewed: 12/26/2014 Elsevier Interactive Patient Education  2018 Elsevier Inc.  

## 2016-12-27 NOTE — Progress Notes (Signed)
Tim Patterson 36 y.o.   Chief Complaint  Patient presents with  . Urinary Tract Infection  . Dysuria    x 3 weeks    HISTORY OF PRESENT ILLNESS: This is a 36 y.o. male complaining of dysuria and hematuria x 3 weeks.  Urinary Tract Infection   This is a new problem. The current episode started 1 to 4 weeks ago. The problem occurs intermittently. The problem has been waxing and waning. The quality of the pain is described as burning. The pain is at a severity of 3/10. The pain is mild. There has been no fever. He is sexually active (wife only). There is no history of pyelonephritis. Associated symptoms include frequency, hematuria and urgency. Pertinent negatives include no chills, discharge, flank pain, nausea or vomiting. Treatments tried: Azo. The treatment provided no relief. There is no history of catheterization, kidney stones, recurrent UTIs, urinary stasis or a urological procedure.     Prior to Admission medications   Medication Sig Start Date End Date Taking? Authorizing Provider  amoxicillin-clavulanate (AUGMENTIN) 875-125 MG per tablet Take 1 tablet by mouth 2 (two) times daily. Patient not taking: Reported on 12/24/2014 09/07/13   Tim Dane, MD  ibuprofen (ADVIL,MOTRIN) 600 MG tablet Take 1 tablet (600 mg total) by mouth every 8 (eight) hours as needed. Patient not taking: Reported on 12/27/2016 03/07/14   Tim Albee, MD  methocarbamol (ROBAXIN-750) 750 MG tablet Take 1 tablet (750 mg total) by mouth 4 (four) times daily. Patient not taking: Reported on 12/24/2014 03/07/14   Tim Albee, MD  penicillin v potassium (VEETID) 500 MG tablet Take 1 tablet (500 mg total) by mouth 3 (three) times daily. Patient not taking: Reported on 12/26/2014 12/24/14   Tim Najjar, MD    No Known Allergies  Patient Active Problem List   Diagnosis Date Noted  . Cough 06/05/2012    History reviewed. No pertinent past medical history.  History reviewed. No pertinent surgical  history.  Social History   Socioeconomic History  . Marital status: Single    Spouse name: Not on file  . Number of children: 1  . Years of education: Not on file  . Highest education level: Not on file  Social Needs  . Financial resource strain: Not on file  . Food insecurity - worry: Not on file  . Food insecurity - inability: Not on file  . Transportation needs - medical: Not on file  . Transportation needs - non-medical: Not on file  Occupational History  . Occupation: Location manager  Tobacco Use  . Smoking status: Never Smoker  . Smokeless tobacco: Never Used  Substance and Sexual Activity  . Alcohol use: No  . Drug use: No  . Sexual activity: Yes  Other Topics Concern  . Not on file  Social History Narrative  . Not on file    History reviewed. No pertinent family history.   Review of Systems  Constitutional: Negative for chills and fever.  HENT: Negative.  Negative for sore throat.   Eyes: Negative.  Negative for discharge and redness.  Respiratory: Negative.  Negative for cough and shortness of breath.   Cardiovascular: Negative.  Negative for chest pain and palpitations.  Gastrointestinal: Negative for abdominal pain, diarrhea, nausea and vomiting.  Genitourinary: Positive for dysuria, frequency, hematuria and urgency. Negative for flank pain.  Musculoskeletal: Negative for back pain, myalgias and neck pain.  Skin: Negative.  Negative for rash.  Neurological: Negative.  Negative for dizziness and  headaches.  Endo/Heme/Allergies: Negative.   All other systems reviewed and are negative.    Vitals:   12/27/16 0823  BP: 119/79  Pulse: 94  Resp: 18  Temp: 98.4 F (36.9 C)  SpO2: 98%     Physical Exam  Constitutional: He is oriented to person, place, and time. He appears well-developed and well-nourished.  HENT:  Head: Normocephalic and atraumatic.  Nose: Nose normal.  Mouth/Throat: Oropharynx is clear and moist.  Eyes: Conjunctivae and EOM are  normal. Pupils are equal, round, and reactive to light.  Neck: Normal range of motion. Neck supple. No JVD present.  Cardiovascular: Normal rate, regular rhythm, normal heart sounds and intact distal pulses.  Pulmonary/Chest: Effort normal and breath sounds normal.  Abdominal: Soft. Bowel sounds are normal. He exhibits no distension. There is no tenderness.  Musculoskeletal: Normal range of motion.  Lymphadenopathy:    He has no cervical adenopathy.  Neurological: He is alert and oriented to person, place, and time. No sensory deficit. He exhibits normal muscle tone.  Skin: Skin is warm and dry. Capillary refill takes less than 2 seconds. No rash noted.  Psychiatric: He has a normal mood and affect. His behavior is normal.  Vitals reviewed.  Results for orders placed or performed in visit on 12/27/16 (from the past 24 hour(s))  POCT urinalysis dipstick     Status: Abnormal   Collection Time: 12/27/16  8:42 AM  Result Value Ref Range   Color, UA yellow yellow   Clarity, UA clear clear   Glucose, UA negative negative mg/dL   Bilirubin, UA negative negative   Ketones, POC UA negative negative mg/dL   Spec Grav, UA 2.9561.025 2.1301.010 - 1.025   Blood, UA moderate (A) negative   pH, UA 7.0 5.0 - 8.0   Protein Ur, POC negative negative mg/dL   Urobilinogen, UA 0.2 0.2 or 1.0 E.U./dL   Nitrite, UA Negative Negative   Leukocytes, UA Trace (A) Negative     ASSESSMENT & PLAN: Tim Patterson was seen today for urinary tract infection and dysuria.  Diagnoses and all orders for this visit:  Lower urinary tract symptoms (LUTS) -     GC/Chlamydia Probe Amp -     Urine Culture -     POCT urinalysis dipstick  Hematuria, unspecified type  Other orders -     ciprofloxacin (CIPRO) 500 MG tablet; Take 1 tablet (500 mg total) 2 (two) times daily for 7 days by mouth.    Patient Instructions       IF you received an x-ray today, you will receive an invoice from [facility]. Please contact  [facility] at [phone] with questions or concerns regarding your invoice.   IF you received labwork today, you will receive an invoice from [facility]. Please contact [facility] at [phone] with questions or concerns regarding your invoice.   Our billing staff will not be able to assist you with questions regarding bills from these companies.  You will be contacted with the lab results as soon as they are available. The fastest way to get your results is to activate your My Chart account. Instructions are located on the last page of this paperwork. If you have not heard from us regarding the results in 2 weeks, please contact this office.      Urinary Tract Infection, Adult A urinary tract infection (UTI) is an infection of any part of the urinary tract. The urinary tract includes the:  Kidneys.  Ureters.  Bladder.  Urethra.  These organs make, store, and get rid of pee (urine) in the body. Follow these instructions at home:  Take over-the-counter and prescription medicines only as told by your doctor.  If you were prescribed an antibiotic medicine, take it as told by your doctor. Do not stop taking the antibiotic even if you start to feel better.  Avoid the following drinks: ? Alcohol. ? Caffeine. ? Tea. ? Carbonated drinks.  Drink enough fluid to keep your pee clear or pale yellow.  Keep all follow-up visits as told by your doctor. This is important.  Make sure to: ? Empty your bladder often and completely. Do not to hold pee for long periods of time. ? Empty your bladder before and after sex. ? Wipe from front to back after a bowel movement if you are male. Use each tissue one time when you wipe. Contact a doctor if:  You have back pain.  You have a fever.  You feel sick to your stomach (nauseous).  You throw up (vomit).  Your symptoms do not get better after 3 days.  Your symptoms go away and then come back. Get help right away if:  You  have very bad back pain.  You have very bad lower belly (abdominal) pain.  You are throwing up and cannot keep down any medicines or water. This information is not intended to replace advice given to you by your health care provider. Make sure you discuss any questions you have with your health care provider. Document Released: 07/24/2007 Document Revised: 07/13/2015 Document Reviewed: 12/26/2014 Elsevier Interactive Patient Education  2018 Elsevier Inc.      Edwina BarthMiguel Edger Husain, MD [facility]

## 2016-12-28 LAB — URINE CULTURE: Organism ID, Bacteria: NO GROWTH

## 2016-12-29 LAB — GC/CHLAMYDIA PROBE AMP
CHLAMYDIA, DNA PROBE: NEGATIVE
NEISSERIA GONORRHOEAE BY PCR: NEGATIVE

## 2017-01-03 ENCOUNTER — Telehealth: Payer: Self-pay | Admitting: Emergency Medicine

## 2017-01-03 NOTE — Telephone Encounter (Signed)
Copied from CRM [id number]. Topic: Quick Communication - See Telephone Encounter >> Jan 03, 2017 10:28 AM Jolayne Hainesaylor, Brittany L wrote: CRM for notification. See Telephone encounter for:   Patient called and said he was seen on 11/9 and the DR gave him CIPROFLOXACIN & he said it is not helping, he is still having the same problems as what he was seen for. He wants to know if something else could be called into [facility] on the [address] of [address].  01/03/17.

## 2017-01-03 NOTE — Telephone Encounter (Signed)
Pt states Cipro is not working and would like to know if another medication could be called in.

## 2017-01-06 NOTE — Telephone Encounter (Signed)
Unable to leave message voicemail not set up.  Patient will need to be seen if he is still having symptoms

## 2017-01-10 ENCOUNTER — Ambulatory Visit: Payer: BLUE CROSS/BLUE SHIELD | Admitting: Physician Assistant

## 2017-01-10 ENCOUNTER — Telehealth (HOSPITAL_COMMUNITY): Payer: Self-pay

## 2017-01-10 NOTE — Telephone Encounter (Signed)
Copied from CRM [id number]. Topic: Inquiry >> Jan 10, 2017  9:40 AM Anice PaganiniMunoz, Mazy Culton I, NT wrote: Reason for CRM: pt call for Rx Refill on Elisquis 205 Mg his [facility] @ [address] [place] [phone]

## 2017-02-17 ENCOUNTER — Ambulatory Visit: Payer: BLUE CROSS/BLUE SHIELD | Admitting: Physician Assistant

## 2017-02-17 ENCOUNTER — Other Ambulatory Visit: Payer: Self-pay

## 2017-02-17 ENCOUNTER — Encounter: Payer: Self-pay | Admitting: Physician Assistant

## 2017-02-17 VITALS — BP 110/66 | HR 118 | Temp 99.0°F | Resp 18 | Ht 69.0 in | Wt 178.0 lb

## 2017-02-17 DIAGNOSIS — R6889 Other general symptoms and signs: Secondary | ICD-10-CM | POA: Diagnosis not present

## 2017-02-17 LAB — POC INFLUENZA A&B (BINAX/QUICKVUE)
INFLUENZA A, POC: NEGATIVE
Influenza B, POC: NEGATIVE

## 2017-02-17 MED ORDER — BENZONATATE 100 MG PO CAPS: 100.0000 mg | ORAL_CAPSULE | Freq: Three times a day (TID) | ORAL | 0 refills | Status: DC | PRN

## 2017-02-17 MED ORDER — OSELTAMIVIR PHOSPHATE 75 MG PO CAPS: 75.0000 mg | ORAL_CAPSULE | Freq: Two times a day (BID) | ORAL | 0 refills | Status: DC

## 2017-02-17 MED ORDER — GUAIFENESIN ER 1200 MG PO TB12: 1.0000 | ORAL_TABLET | Freq: Two times a day (BID) | ORAL | 1 refills | Status: DC | PRN

## 2017-02-17 MED ORDER — AZELASTINE HCL 0.1 % NA SOLN: 2.0000 | Freq: Two times a day (BID) | NASAL | 0 refills | Status: DC

## 2017-02-17 NOTE — Progress Notes (Signed)
Patient ID: Tim Patterson, male    DOB: 03/16/1980, 36 y.o.   MRN: [id number]  PCP: Patient, No Pcp Per  Chief Complaint  Patient presents with  . Flu like Symptoms    x1 day, pt states he has fever, chills, and fatigue. Pt states he has body aches and just feels bad. Pt states he has some cough. Pt states he has been taking Dayquil and Advil    Subjective:   Presents for evaluation of possible influenza.  Yesterday he suddenly developed fever, chills, fatigue, total body aches and cough. 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 Coughing a lot. Produces yellowish sputum. Stuffy, runny nose. Headache.  "Feels like my head is full." Sinus pressure.  No ear pain.  No joint pain. Some sore throat. No rash.  No flu vaccine this season.   Review of Systems As above.    Patient Active Problem List   Diagnosis Date Noted  . Lower urinary tract symptoms (LUTS) 12/27/2016  . Hematuria 12/27/2016  . Cough 06/05/2012     Prior to Admission medications   Medication Sig Start Date End Date Taking? Authorizing Provider  amoxicillin-clavulanate (AUGMENTIN) 875-125 MG per tablet Take 1 tablet by mouth 2 (two) times daily. Patient not taking: Reported on 12/24/2014 09/07/13   Carmelina DaneAnderson, Elice Crigger S, MD  ibuprofen (ADVIL,MOTRIN) 600 MG tablet Take 1 tablet (600 mg total) by mouth every 8 (eight) hours as needed. Patient not taking: Reported on 12/27/2016 03/07/14   Jonita AlbeeGuest, Chris W, MD  methocarbamol (ROBAXIN-750) 750 MG tablet Take 1 tablet (750 mg total) by mouth 4 (four) times daily. Patient not taking: Reported on 12/24/2014 03/07/14   Jonita AlbeeGuest, Chris W, MD  penicillin v potassium (VEETID) 500 MG tablet Take 1 tablet (500 mg total) by mouth 3 (three) times daily. Patient not taking: Reported on 12/26/2014 12/24/14   Peyton NajjarHopper, David H, MD     No Known Allergies     Objective:  Physical Exam  Constitutional: He is oriented to person, place, and time. He appears  well-developed and well-nourished. No distress.  BP 110/66 (BP Location: Left Arm, Patient Position: Sitting, Cuff Size: Normal)   Pulse (!) 118   Temp 99 F (37.2 C) (Oral)   Resp 18   Ht 5\' 9"  (1.753 m)   Wt 178 lb (80.7 kg)   SpO2 98%   BMI 26.29 kg/m    HENT:  Head: Normocephalic and atraumatic.  Right Ear: Hearing, tympanic membrane, external ear and ear canal normal.  Left Ear: Hearing, tympanic membrane, external ear and ear canal normal.  Nose: Mucosal edema and rhinorrhea present.  No foreign bodies. Right sinus exhibits no maxillary sinus tenderness and no frontal sinus tenderness. Left sinus exhibits no maxillary sinus tenderness and no frontal sinus tenderness.  Mouth/Throat: Uvula is midline, oropharynx is clear and moist and mucous membranes are normal. No uvula swelling. No oropharyngeal exudate.  Eyes: Conjunctivae and EOM are normal. Pupils are equal, round, and reactive to light. Right eye exhibits no discharge. Left eye exhibits no discharge. No scleral icterus.  Neck: Trachea normal, normal range of motion and full passive range of motion without pain. Neck supple. No thyroid mass and no thyromegaly present.  Cardiovascular: Normal rate, regular rhythm and normal heart sounds.  Pulmonary/Chest: Effort normal and breath sounds normal.  Lymphadenopathy:       Head (right side): No submandibular, no tonsillar, no preauricular, no posterior auricular and no occipital adenopathy present.  Head (left side): No submandibular, no tonsillar, no preauricular and no occipital adenopathy present.    He has no cervical adenopathy.       Right: No supraclavicular adenopathy present.       Left: No supraclavicular adenopathy present.  Neurological: He is alert and oriented to person, place, and time. He has normal strength. No cranial nerve deficit or sensory deficit.  Skin: Skin is warm, dry and intact. No rash noted.  Psychiatric: He has a normal mood and affect. His speech  is normal and behavior is normal.           Assessment & Plan:   1. Flu-like symptoms Suspect influenza, despite negative point-of-care test.  Treat as such.  Out of work times 7 days.  Note provided.  Anticipatory guidance and supportive care provided. - POC Influenza A&B(BINAX/QUICKVUE) - oseltamivir (TAMIFLU) 75 MG capsule; Take 1 capsule (75 mg total) by mouth 2 (two) times daily.  Dispense: 10 capsule; Refill: 0 - azelastine (ASTELIN) 0.1 % nasal spray; Place 2 sprays into both nostrils 2 (two) times daily. Use in each nostril as directed  Dispense: 30 mL; Refill: 0 - benzonatate (TESSALON) 100 MG capsule; Take 1-2 capsules (100-200 mg total) by mouth 3 (three) times daily as needed for cough.  Dispense: 40 capsule; Refill: 0 - Guaifenesin (MUCINEX MAXIMUM STRENGTH) 1200 MG TB12; Take 1 tablet (1,200 mg total) by mouth every 12 (twelve) hours as needed.  Dispense: 14 tablet; Refill: 1    Return if symptoms worsen or fail to improve.   Fernande Brashelle S. Myrle Dues, PA-C [facility]

## 2017-02-17 NOTE — Patient Instructions (Addendum)
I suspect that you have The Flu, despite the negative test, and am treating you as such.    IF you received an x-ray today, you will receive an invoice from [facility]. Please contact [facility] at [phone] with questions or concerns regarding your invoice.   IF you received labwork today, you will receive an invoice from [facility]. Please contact [facility] at [phone] with questions or concerns regarding your invoice.   Our billing staff will not be able to assist you with questions regarding bills from these companies.  You will be contacted with the lab results as soon as they are available. The fastest way to get your results is to activate your My Chart account. Instructions are located on the last page of this paperwork. If you have not heard from us regarding the results in 2 weeks, please contact this office.     Influenza, Adult Influenza, more commonly known as "the flu," is a viral infection that primarily affects the respiratory tract. The respiratory tract includes organs that help you breathe, such as the lungs, nose, and throat. The flu causes many common cold symptoms, as well as a high fever and body aches. The flu spreads easily from person to person (is contagious). Getting a flu shot (influenza vaccination) every year is the best way to prevent influenza. What are the causes? Influenza is caused by a virus. You can catch the virus by:  Breathing in droplets from an infected person's cough or sneeze.  Touching something that was recently contaminated with the virus and then touching your mouth, nose, or eyes.  What increases the risk? The following factors may make you more likely to get the flu:  Not cleaning your hands frequently with soap and water or alcohol-based hand sanitizer.  Having close contact with many people during cold and flu season.  Touching your mouth, eyes, or nose without washing or sanitizing your hands first.  Not  drinking enough fluids or not eating a healthy diet.  Not getting enough sleep or exercise.  Being under a high amount of stress.  Not getting a yearly (annual) flu shot.  You may be at a higher risk of complications from the flu, such as a severe lung infection (pneumonia), if you:  Are over the age of 565.  Are pregnant.  Have a weakened disease-fighting system (immune system). You may have a weakened immune system if you: ? Have HIV or AIDS. ? Are undergoing chemotherapy. ? Aretaking medicines that reduce the activity of (suppress) the immune system.  Have a long-term (chronic) illness, such as heart disease, kidney disease, diabetes, or lung disease.  Have a liver disorder.  Are obese.  Have anemia.  What are the signs or symptoms? Symptoms of this condition typically last 4-10 days and may include:  Fever.  Chills.  Headache, body aches, or muscle aches.  Sore throat.  Cough.  Runny or congested nose.  Chest discomfort and cough.  Poor appetite.  Weakness or tiredness (fatigue).  Dizziness.  Nausea or vomiting.  How is this diagnosed? This condition may be diagnosed based on your medical history and a physical exam. Your health care provider may do a nose or throat swab test to confirm the diagnosis. How is this treated? If influenza is detected early, you can be treated with antiviral medicine that can reduce the length of your illness and the severity of your symptoms. This medicine may be given by mouth (orally) or through an IV tube that is  inserted in one of your veins. The goal of treatment is to relieve symptoms by taking care of yourself at home. This may include taking over-the-counter medicines, drinking plenty of fluids, and adding humidity to the air in your home. In some cases, influenza goes away on its own. Severe influenza or complications from influenza may be treated in a hospital. Follow these instructions at home:  Take  over-the-counter and prescription medicines only as told by your health care provider.  Use a cool mist humidifier to add humidity to the air in your home. This can make breathing easier.  Rest as needed.  Drink enough fluid to keep your urine clear or pale yellow.  Cover your mouth and nose when you cough or sneeze.  Wash your hands with soap and water often, especially after you cough or sneeze. If soap and water are not available, use hand sanitizer.  Stay home from work or school as told by your health care provider. Unless you are visiting your health care provider, try to avoid leaving home until your fever has been gone for 24 hours without the use of medicine.  Keep all follow-up visits as told by your health care provider. This is important. How is this prevented?  Getting an annual flu shot is the best way to avoid getting the flu. You may get the flu shot in late summer, fall, or winter. Ask your health care provider when you should get your flu shot.  Wash your hands often or use hand sanitizer often.  Avoid contact with people who are sick during cold and flu season.  Eat a healthy diet, drink plenty of fluids, get enough sleep, and exercise regularly. Contact a health care provider if:  You develop new symptoms.  You have: ? Chest pain. ? Diarrhea. ? A fever.  Your cough gets worse.  You produce more mucus.  You feel nauseous or you vomit. Get help right away if:  You develop shortness of breath or difficulty breathing.  Your skin or nails turn a bluish color.  You have severe pain or stiffness in your neck.  You develop a sudden headache or sudden pain in your face or ear.  You cannot stop vomiting. This information is not intended to replace advice given to you by your health care provider. Make sure you discuss any questions you have with your health care provider. Document Released: 02/02/2000 Document Revised: 07/13/2015 Document Reviewed:  11/29/2014 Elsevier Interactive Patient Education  2017 [employer]

## 2017-02-19 ENCOUNTER — Telehealth: Payer: Self-pay | Admitting: Physician Assistant

## 2017-02-19 NOTE — Telephone Encounter (Signed)
Copied from CRM [id number]. Topic: Quick Communication - See Telephone Encounter >> Feb 19, 2017  3:14 PM Windy KalataMichael, Terrion Poblano L, NT wrote: CRM for notification. See Telephone encounter for:  02/19/17.  Patient wife is calling and states he was seen 12/31 for a really bad cough. He was prescribed Tessalon and states it is not helping and he is feeling worse and would like to know if there is something else that can be called in. Please advise  [facility].

## 2017-02-19 NOTE — Telephone Encounter (Signed)
Pt made a new appointment with Barnett AbuWiseman tomorrow

## 2017-02-20 ENCOUNTER — Other Ambulatory Visit: Payer: Self-pay

## 2017-02-20 ENCOUNTER — Ambulatory Visit (INDEPENDENT_AMBULATORY_CARE_PROVIDER_SITE_OTHER): Payer: BLUE CROSS/BLUE SHIELD | Admitting: Physician Assistant

## 2017-02-20 ENCOUNTER — Encounter: Payer: Self-pay | Admitting: Physician Assistant

## 2017-02-20 ENCOUNTER — Ambulatory Visit (INDEPENDENT_AMBULATORY_CARE_PROVIDER_SITE_OTHER): Payer: BLUE CROSS/BLUE SHIELD

## 2017-02-20 VITALS — BP 116/72 | HR 103 | Temp 98.2°F | Resp 18 | Ht 70.32 in | Wt 178.2 lb

## 2017-02-20 DIAGNOSIS — R0981 Nasal congestion: Secondary | ICD-10-CM | POA: Diagnosis not present

## 2017-02-20 DIAGNOSIS — R059 Cough, unspecified: Secondary | ICD-10-CM

## 2017-02-20 DIAGNOSIS — J181 Lobar pneumonia, unspecified organism: Secondary | ICD-10-CM

## 2017-02-20 DIAGNOSIS — R05 Cough: Secondary | ICD-10-CM | POA: Diagnosis not present

## 2017-02-20 DIAGNOSIS — J189 Pneumonia, unspecified organism: Secondary | ICD-10-CM

## 2017-02-20 LAB — POCT CBC
GRANULOCYTE PERCENT: 74.4 % (ref 37–80)
HCT, POC: 45 % (ref 43.5–53.7)
Hemoglobin: 14.9 g/dL (ref 14.1–18.1)
Lymph, poc: 1.7 (ref 0.6–3.4)
MCH: 27.5 pg (ref 27–31.2)
MCHC: 33.2 g/dL (ref 31.8–35.4)
MCV: 82.8 fL (ref 80–97)
MID (CBC): 0.5 (ref 0–0.9)
MPV: 7.4 fL (ref 0–99.8)
POC Granulocyte: 6.4 (ref 2–6.9)
POC LYMPH PERCENT: 20.2 %L (ref 10–50)
POC MID %: 5.4 % (ref 0–12)
Platelet Count, POC: 154 10*3/uL (ref 142–424)
RBC: 5.44 M/uL (ref 4.69–6.13)
RDW, POC: 13.6 %
WBC: 8.6 10*3/uL (ref 4.6–10.2)

## 2017-02-20 MED ORDER — FLUTICASONE PROPIONATE 50 MCG/ACT NA SUSP: 2.0000 | Freq: Every day | NASAL | 0 refills | Status: DC

## 2017-02-20 MED ORDER — HYDROCODONE-HOMATROPINE 5-1.5 MG/5ML PO SYRP: 5.0000 mL | ORAL_SOLUTION | Freq: Three times a day (TID) | ORAL | 0 refills | Status: DC | PRN

## 2017-02-20 MED ORDER — AZITHROMYCIN 250 MG PO TABS: ORAL_TABLET | ORAL | 0 refills | Status: DC

## 2017-02-20 NOTE — Patient Instructions (Addendum)
Your chest x-ray is consistent with early pneumonia.  We are going to treat with antibiotic at this time.  I have also given you a cough syrup to use at nighttime to help you sleep.  Keep in mind that this can cause you to be very drowsy so use it cautiously.  I have also given you a prescription for Flonase to use for nasal congestion.  Please return if any of your symptoms worsen or he develop new concerning symptoms. Community-Acquired Pneumonia, Adult Pneumonia is an infection of the lungs. One type of pneumonia can happen while a person is in a hospital. A different type can happen when a person is not in a hospital (community-acquired pneumonia). It is easy for this kind to spread from person to person. It can spread to you if you breathe near an infected person who coughs or sneezes. Some symptoms include:  A dry cough.  A wet (productive) cough.  Fever.  Sweating.  Chest pain.  Follow these instructions at home:  Take over-the-counter and prescription medicines only as told by your doctor. ? Only take cough medicine if you are losing sleep. ? If you were prescribed an antibiotic medicine, take it as told by your doctor. Do not stop taking the antibiotic even if you start to feel better.  Sleep with your head and neck raised (elevated). You can do this by putting a few pillows under your head, or you can sleep in a recliner.  Do not use tobacco products. These include cigarettes, chewing tobacco, and e-cigarettes. If you need help quitting, ask your doctor.  Drink enough water to keep your pee (urine) clear or pale yellow. A shot (vaccine) can help prevent pneumonia. Shots are often suggested for:  People older than 37 years of age.  People older than 37 years of age: ? Who are having cancer treatment. ? Who have long-term (chronic) lung disease. ? Who have problems with their body's defense system (immune system).  You may also prevent pneumonia if you take these  actions:  Get the flu (influenza) shot every year.  Go to the dentist as often as told.  Wash your hands often. If soap and water are not available, use hand sanitizer.  Contact a doctor if:  You have a fever.  You lose sleep because your cough medicine does not help. Get help right away if:  You are short of breath and it gets worse.  You have more chest pain.  Your sickness gets worse. This is very serious if: ? You are an older adult. ? Your body's defense system is weak.  You cough up blood. This information is not intended to replace advice given to you by your health care provider. Make sure you discuss any questions you have with your health care provider. Document Released: 07/24/2007 Document Revised: 07/13/2015 Document Reviewed: 06/01/2014 Elsevier Interactive Patient Education  2018 [employer]   IF you received an x-ray today, you will receive an invoice from [facility]. Please contact [facility] at [phone] with questions or concerns regarding your invoice.   IF you received labwork today, you will receive an invoice from [facility]. Please contact [facility] at [phone] with questions or concerns regarding your invoice.   Our billing staff will not be able to assist you with questions regarding bills from these companies.  You will be contacted with the lab results as soon as they are available. The fastest way to get your results is to activate your My  Chart account. Instructions are located on the last page of this paperwork. If you have not heard from [place] regarding the results in 2 weeks, please contact this office.

## 2017-02-20 NOTE — Progress Notes (Signed)
MRN: [id number] DOB: 07/28/1980  Subjective:   Tim Patterson is a 37 y.o. male presenting for chief complaint of Cough (X 5 days-  cough make chest pain ) and Generalized Body Aches . Initially seen by PA Chelle on 02/17/17 at that time he was having sudden fever, chills, fatigue, cough, and body aches.  He was treated with Tamiflu and also given symptomatic relief with Astelin nasal spray, Tessalon Perles, Mucinex.  Reports one week history of worsening productive cough, sinus pressure, and chest congestion. Has tried tamiflu, tessalon perles, and nasal spray with no relief. Notes his fever resolved but his cough has gotten worse. He cannot sleep at night time. Denies ear pain, sore throat, wheezing and shortness of breath, nausea, vomiting, abdominal pain and diarrhea. Has not sick contact with anyone. No history of seasonal allergies, no history of asthma. No recent hospitalizations.  Denies smoking. l. Denies any other aggravating or relieving factors, no other questions or concerns.  Tim Patterson has a current medication list which includes the following prescription(s): azelastine, ibuprofen, azithromycin, benzonatate, guaifenesin, methocarbamol, oseltamivir, and penicillin v potassium. Also has No Known Allergies.  Tim Patterson  has no past medical history on file. Also  has no past surgical history on file.    Social History   Socioeconomic History  . Marital status: Single    Spouse name: Not on file  . Number of children: 2  . Years of education: Not on file  . Highest education level: Not on file  Social Needs  . Financial resource strain: Not on file  . Food insecurity - worry: Not on file  . Food insecurity - inability: Not on file  . Transportation needs - medical: Not on file  . Transportation needs - non-medical: Not on file  Occupational History  . Occupation: Location manager  Tobacco Use  . Smoking status: Never Smoker  . Smokeless tobacco: Never Used  Substance and Sexual  Activity  . Alcohol use: No  . Drug use: No  . Sexual activity: Yes  Other Topics Concern  . Not on file  Social History Narrative   From [place]. Came to the [place] in 2002.    Objective:   Vitals: BP 116/72 (BP Location: Left Arm, Patient Position: Sitting, Cuff Size: Normal)   Pulse (!) 103   Temp 98.2 F (36.8 C) (Oral)   Resp 18   Ht 5' 10.32" (1.786 m)   Wt 178 lb 3.2 oz (80.8 kg)   SpO2 99%   BMI 25.34 kg/m   Physical Exam  Constitutional: He is oriented to person, place, and time. He appears well-developed and well-nourished. He appears distressed (appears uncomfortable sitting on exam table).  HENT:  Head: Normocephalic and atraumatic.  Right Ear: Tympanic membrane, external ear and ear canal normal.  Left Ear: Tympanic membrane, external ear and ear canal normal.  Nose: Mucosal edema (moderate bilaterally) present. No rhinorrhea. Right sinus exhibits no maxillary sinus tenderness and no frontal sinus tenderness. Left sinus exhibits no maxillary sinus tenderness and no frontal sinus tenderness.  Mouth/Throat: Uvula is midline, oropharynx is clear and moist and mucous membranes are normal. Tonsils are 1+ on the right. Tonsils are 1+ on the left. No tonsillar exudate.  Eyes: Conjunctivae are normal.  Neck: Normal range of motion.  Cardiovascular: Normal rate, regular rhythm and normal heart sounds.  Pulmonary/Chest: Breath sounds normal. No accessory muscle usage. No respiratory distress. He has no decreased breath sounds. He has no wheezes. He has no rhonchi. He  has no rales.  Intermittent coughing noted throughout exam.   Lymphadenopathy:       Head (right side): No submental, no submandibular, no tonsillar, no preauricular, no posterior auricular and no occipital adenopathy present.       Head (left side): No submental, no submandibular, no tonsillar, no preauricular, no posterior auricular and no occipital adenopathy present.    He has no cervical adenopathy.        Right: No supraclavicular adenopathy present.       Left: No supraclavicular adenopathy present.  Neurological: He is alert and oriented to person, place, and time.  Skin: Skin is warm and dry.  Psychiatric: He has a normal mood and affect.  Vitals reviewed.   Results for orders placed or performed in visit on 02/20/17 (from the past 24 hour(s))  POCT CBC     Status: None   Collection Time: 02/20/17  9:05 AM  Result Value Ref Range   WBC 8.6 4.6 - 10.2 K/uL   Lymph, poc 1.7 0.6 - 3.4   POC LYMPH PERCENT 20.2 10 - 50 %L   MID (cbc) 0.5 0 - 0.9   POC MID % 5.4 0 - 12 %M   POC Granulocyte 6.4 2 - 6.9   Granulocyte percent 74.4 37 - 80 %G   RBC 5.44 4.69 - 6.13 M/uL   Hemoglobin 14.9 14.1 - 18.1 g/dL   HCT, POC 60.445.0 54.043.5 - 53.7 %   MCV 82.8 80 - 97 fL   MCH, POC 27.5 27 - 31.2 pg   MCHC 33.2 31.8 - 35.4 g/dL   RDW, POC 98.113.6 %   Platelet Count, POC 154 142 - 424 K/uL   MPV 7.4 0 - 99.8 fL    Dg Chest 2 View  Result Date: 02/20/2017 CLINICAL DATA:  Worsening cough EXAM: CHEST  2 VIEW COMPARISON:  06/29/2012 FINDINGS: Slight increased markings in the right upper lobe could reflect early infiltrate. Left lung is clear. Heart is normal size. No effusions or acute bony abnormality. IMPRESSION: Questionable early right upper lobe infiltrate/pneumonia. Electronically Signed   By: Charlett NoseKevin  Dover M.D.   On: 02/20/2017 08:59    Assessment and Plan :  1. Cough - POCT CBC - DG Chest 2 View; Future - HYDROcodone-homatropine (HYCODAN) 5-1.5 MG/5ML syrup; Take 5 mLs by mouth every 8 (eight) hours as needed for cough.  Dispense: 120 mL; Refill: 0 2. Nasal congestion - fluticasone (FLONASE) 50 MCG/ACT nasal spray; Place 2 sprays into both nostrils daily.  Dispense: 16 g; Refill: 0 3. Pneumonia of right upper lobe due to infectious organism (HCC) Hx and CXR findings consistent with pneumonia. Pt appears like he does not feel well but is overall stable. SpO2 at 99%. He is afebrile. WBC wnl. Will treat  accordingly with abx at this time. Pt also provided symptomatic relief. Advised to return to clinic if symptoms worsen, do not improve, or as needed. - azithromycin (ZITHROMAX) 250 MG tablet; Take 2 tabs PO x 1 dose, then 1 tab PO QD x 4 days  Dispense: 6 tablet; Refill: 0  Benjiman CoreBrittany Cicley Ganesh, PA-C  Primary Care at [facility] 02/20/2017 9:17 AM

## 2020-05-25 ENCOUNTER — Other Ambulatory Visit: Payer: Self-pay

## 2020-05-25 ENCOUNTER — Ambulatory Visit
Admission: EM | Admit: 2020-05-25 | Discharge: 2020-05-25 | Disposition: A | Discharge status: Home / Self Care | Payer: Self-pay | Attending: Physician Assistant | Admitting: Physician Assistant

## 2020-05-25 DIAGNOSIS — B029 Zoster without complications: Secondary | ICD-10-CM

## 2020-05-25 MED ORDER — VALACYCLOVIR HCL 1 G PO TABS: 1000.0000 mg | ORAL_TABLET | Freq: Three times a day (TID) | ORAL | 0 refills | Status: AC

## 2020-05-25 NOTE — ED Triage Notes (Signed)
Pt c/o rash to rt upper back going across to rt side since Monday. Pt rash is painful.

## 2020-05-25 NOTE — Discharge Instructions (Signed)
Take medication three times a day. You can use over the counter medicine for symptom relief. If anything worsens please come back to see [place].

## 2020-05-25 NOTE — ED Provider Notes (Signed)
[facility]    CSN: [id number] Arrival date & time: 05/25/20  1425      History   Chief Complaint Chief Complaint  Patient presents with  . Rash    HPI Tim Patterson is a 40 y.o. male.   Tim Patterson presents today with a 2 day history of rash. Reports prodrome of intense pruritis with rash developing yesterday. Reports pruritis has improved but not resolved. He denies pain, fever, nausea, vomiting, chest pain, shortness of breath. He has tried a topical medication without improvement of symptoms. He denies any new medications, soaps, detergents, personal hygiene products. He denies any recent illness. Denies episodes of similar symptoms in the past. He denies history of dermatological condition.      History reviewed. No pertinent past medical history.  Patient Active Problem List   Diagnosis Date Noted  . Lower urinary tract symptoms (LUTS) 12/27/2016  . Hematuria 12/27/2016  . Cough 06/05/2012    History reviewed. No pertinent surgical history.     Home Medications    Prior to Admission medications   Medication Sig Start Date End Date Taking? Authorizing Provider  valACYclovir (VALTREX) 1000 MG tablet Take 1 tablet (1,000 mg total) by mouth 3 (three) times daily for 7 days. 05/25/20 06/01/20 Yes Launi Asencio K, PA-C  ibuprofen (ADVIL,MOTRIN) 600 MG tablet Take 1 tablet (600 mg total) by mouth every 8 (eight) hours as needed. 03/07/14   Jonita Albee, MD    Family History History reviewed. No pertinent family history.  Social History Social History   Tobacco Use  . Smoking status: Never Smoker  . Smokeless tobacco: Never Used  Vaping Use  . Vaping Use: Never used  Substance Use Topics  . Alcohol use: No  . Drug use: No     Allergies   Patient has no known allergies.   Review of Systems Review of Systems  Constitutional: Negative for activity change, appetite change, fatigue and fever.  Respiratory: Negative for cough and shortness of breath.    Cardiovascular: Negative for chest pain.  Gastrointestinal: Negative for abdominal pain, diarrhea, nausea and vomiting.  Musculoskeletal: Negative for arthralgias and myalgias.  Skin: Positive for rash.  Neurological: Negative for dizziness, light-headedness and headaches.     Physical Exam Triage Vital Signs ED Triage Vitals  Enc Vitals Group     BP 05/25/20 1446 126/73     Pulse Rate 05/25/20 1446 85     Resp 05/25/20 1446 16     Temp 05/25/20 1446 98.2 F (36.8 C)     Temp Source 05/25/20 1446 Oral     SpO2 05/25/20 1446 98 %     Weight --      Height --      Head Circumference --      Peak Flow --      Pain Score 05/25/20 1457 7     Pain Loc --      Pain Edu? --      Excl. in GC? --    No data found.  Updated Vital Signs BP 126/73 (BP Location: Right Arm)   Pulse 85   Temp 98.2 F (36.8 C) (Oral)   Resp 16   SpO2 98%   Visual Acuity Right Eye Distance:   Left Eye Distance:   Bilateral Distance:    Right Eye Near:   Left Eye Near:    Bilateral Near:     Physical Exam Vitals reviewed.  Constitutional:      General:  He is awake.     Appearance: Normal appearance. He is normal weight. He is not ill-appearing.     Comments: Very pleasant male appears stated age in no acute distress.   HENT:     Head: Normocephalic and atraumatic.  Cardiovascular:     Rate and Rhythm: Normal rate and regular rhythm.     Heart sounds: No murmur heard.   Pulmonary:     Effort: Pulmonary effort is normal.     Breath sounds: Normal breath sounds. No stridor. No wheezing, rhonchi or rales.     Comments: Clear to auscultation bilaterally  Abdominal:     Palpations: Abdomen is soft.     Tenderness: There is no abdominal tenderness.  Skin:    Findings: Rash present. Rash is macular, papular and vesicular.     Comments: Maculopapular rash with vesicles noted in T6 distribution. No additional rash noted. No bleeding, drainage, streaking noted.   Neurological:     Mental  Status: He is alert.  Psychiatric:        Behavior: Behavior is cooperative.         UC Treatments / Results  Labs (all labs ordered are listed, but only abnormal results are displayed) Labs Reviewed - No data to display  EKG   Radiology No results found.  Procedures Procedures (including critical care time)  Medications Ordered in UC Medications - No data to display  Initial Impression / Assessment and Plan / UC Course  I have reviewed the triage vital signs and the nursing notes.  Pertinent labs & imaging results that were available during my care of the patient were reviewed by me and considered in my medical decision making (see chart for details).     Symptoms consistent with Zoster so patient was started on Valtrex three time a day for a week. He can use over the counter medications for pain relief. He can also use hydrocortisone to manage pruritis. Discussed that this is contagious and he should avoid contact with anyone who has a weakened immune system including pregnant women. Strict return precautions given to which patient expressed understanding.   Final Clinical Impressions(s) / UC Diagnoses   Final diagnoses:  Herpes zoster without complication     Discharge Instructions     Take medication three times a day. You can use over the counter medicine for symptom relief. If anything worsens please come back to see [place].     ED Prescriptions    Medication Sig Dispense Auth. Provider   valACYclovir (VALTREX) 1000 MG tablet Take 1 tablet (1,000 mg total) by mouth 3 (three) times daily for 7 days. 21 tablet Krystie Leiter, Noberto Retort, PA-C     PDMP not reviewed this encounter.   Jeani Hawking, PA-C 05/25/20 1519

## 2023-08-06 ENCOUNTER — Encounter: Payer: Self-pay | Admitting: Emergency Medicine

## 2023-08-06 ENCOUNTER — Ambulatory Visit: Admission: EM | Admit: 2023-08-06 | Discharge: 2023-08-06 | Disposition: A | Discharge status: Home / Self Care

## 2023-08-06 DIAGNOSIS — L299 Pruritus, unspecified: Secondary | ICD-10-CM | POA: Diagnosis not present

## 2023-08-06 DIAGNOSIS — L255 Unspecified contact dermatitis due to plants, except food: Secondary | ICD-10-CM | POA: Diagnosis not present

## 2023-08-06 MED ORDER — DEXAMETHASONE SODIUM PHOSPHATE 10 MG/ML IJ SOLN
10.0000 mg | Freq: Once | INTRAMUSCULAR | Status: AC
  Administered 2023-08-06: 10 mg via INTRAMUSCULAR

## 2023-08-06 MED ORDER — DIPHENHYDRAMINE HCL 25 MG PO TABS: 25.0000 mg | ORAL_TABLET | Freq: Four times a day (QID) | ORAL | 0 refills | Status: AC | PRN

## 2023-08-06 MED ORDER — TRIAMCINOLONE ACETONIDE 0.1 % EX CREA: 1.0000 | TOPICAL_CREAM | Freq: Two times a day (BID) | CUTANEOUS | 0 refills | Status: AC

## 2023-08-06 MED ORDER — METHYLPREDNISOLONE 4 MG PO TBPK: ORAL_TABLET | ORAL | 0 refills | Status: AC

## 2023-08-06 NOTE — ED Triage Notes (Signed)
 Pt presents c/o poison ivy break out x 3 days. 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 Pt reports itching all over his body.

## 2023-08-06 NOTE — ED Provider Notes (Signed)
 [facility]    CSN: [id number] Arrival date & time: 08/06/23  0911      History   Chief Complaint Chief Complaint  Patient presents with   Poison Ivy    HPI Tim Patterson is a 43 y.o. male.   Tim Patterson is a 43 y.o. male that presents with a rash that developed after working in a woody area on Monday. The patient reports that the rash started on Monday afternoon when he got home and was about to take a shower. The rash is present on the patient's legs, arms (primarily the right arm),and belly. The patient denies any rash on their back, genital areas, or face. He also reports severe itching to the affected areas. The patient has tried various over-the-counter treatments, including poison ivy and poison oak creams and sprays, but reports that these have not provided relief. The patient is unsure of the exact cause of the rash. Their boss suggested it might be from red bugs, while a friend mentioned it could be from hemlock poison associated with white flowers. The patient admits to not paying attention to the specific plants or flowers they were around at work.The patient denies experiencing any fevers, body aches, neck pain, or headaches associated with the rash. A coworker reportedly has developed a similar rash after being in the same area.  The following portions of the patient's history were reviewed and updated as appropriate: allergies, current medications, past family history, past medical history, past social history, past surgical history, and problem list.    History reviewed. No pertinent past medical history.  Patient Active Problem List   Diagnosis Date Noted   Lower urinary tract symptoms (LUTS) 12/27/2016   Hematuria 12/27/2016   Cough 06/05/2012    History reviewed. No pertinent surgical history.     Home Medications    Prior to Admission medications   Medication Sig Start Date End Date Taking? Authorizing Provider  diphenhydrAMINE (BENADRYL) 25  MG tablet Take 1 tablet (25 mg total) by mouth every 6 (six) hours as needed for itching. 08/06/23  Yes Maryruth Sol, FNP  meloxicam (MOBIC) 7.5 MG tablet Take 7.5 mg by mouth daily. 06/25/23  Yes [provider]  methylPREDNISolone  (MEDROL  DOSEPAK) 4 MG TBPK tablet Take as directed 08/06/23  Yes Jashan Cotten, FNP  triamcinolone cream (KENALOG) 0.1 % Apply 1 Application topically 2 (two) times daily. Apply to affected areas two times a day until symptoms resolve for up to 2 weeks 08/06/23  Yes Maryruth Sol, FNP  ibuprofen  (ADVIL ,MOTRIN ) 600 MG tablet Take 1 tablet (600 mg total) by mouth every 8 (eight) hours as needed. 03/07/14   Lynnda Sas, MD    Family History History reviewed. No pertinent family history.  Social History Social History   Tobacco Use   Smoking status: Never    Passive exposure: Never   Smokeless tobacco: Never  Vaping Use   Vaping status: Never Used  Substance Use Topics   Alcohol use: No   Drug use: No     Allergies   Patient has no known allergies.   Review of Systems Review of Systems  Constitutional:  Negative for fever.  Musculoskeletal:  Negative for myalgias and neck pain.  Skin:  Positive for rash.  Neurological:  Negative for headaches.  All other systems reviewed and are negative.    Physical Exam Triage Vital Signs ED Triage Vitals  Encounter Vitals Group     BP 08/06/23 0943 128/85  Girls Systolic BP Percentile --      Girls Diastolic BP Percentile --      Boys Systolic BP Percentile --      Boys Diastolic BP Percentile --      Pulse Rate 08/06/23 0943 69     Resp 08/06/23 0943 16     Temp 08/06/23 0943 98.4 F (36.9 C)     Temp Source 08/06/23 0943 Oral     SpO2 08/06/23 0943 98 %     Weight 08/06/23 0942 178 lb 2.1 oz (80.8 kg)     Height --      Head Circumference --      Peak Flow --      Pain Score 08/06/23 0942 0     Pain Loc --      Pain Education --      Exclude from Growth Chart --    No  data found.  Updated Vital Signs BP 128/85 (BP Location: Left Arm)   Pulse 69   Temp 98.4 F (36.9 C) (Oral)   Resp 16   Wt 178 lb 2.1 oz (80.8 kg)   SpO2 98%   BMI 25.33 kg/m   Visual Acuity Right Eye Distance:   Left Eye Distance:   Bilateral Distance:    Right Eye Near:   Left Eye Near:    Bilateral Near:     Physical Exam Vitals reviewed.  Constitutional:      General: He is not in acute distress.    Appearance: Normal appearance. He is not toxic-appearing.  HENT:     Head: Normocephalic.     Mouth/Throat:     Mouth: Mucous membranes are moist.   Eyes:     Conjunctiva/sclera: Conjunctivae normal.    Cardiovascular:     Rate and Rhythm: Normal rate and regular rhythm.     Heart sounds: Normal heart sounds.  Pulmonary:     Effort: Pulmonary effort is normal.     Breath sounds: Normal breath sounds.   Musculoskeletal:        General: Normal range of motion.   Skin:    General: Skin is warm and dry.     Findings: Rash present.     Comments: Erythematous, pustular lesions distributed across the arms, legs, and left abdominal region without any surrounding erythema, swelling, drainage or increased warmth.    Neurological:     General: No focal deficit present.     Mental Status: He is alert and oriented to person, place, and time.      UC Treatments / Results  Labs (all labs ordered are listed, but only abnormal results are displayed) Labs Reviewed - No data to display  EKG   Radiology No results found.  Procedures Procedures (including critical care time)  Medications Ordered in UC Medications  dexamethasone (DECADRON) injection 10 mg (has no administration in time range)    Initial Impression / Assessment and Plan / UC Course  I have reviewed the triage vital signs and the nursing notes.  Pertinent labs & imaging results that were available during my care of the patient were reviewed by me and considered in my medical decision making  (see chart for details).     The patient presents with a pruritic rash that began Monday afternoon following outdoor activity in a wooded area. The rash involves the arms, legs, and abdomen, with the left arm most affected, and no involvement of the face, back, or genital areas. The timing, distribution, and  history of outdoor exposure are consistent with contact dermatitis, likely due to poison ivy or a related irritant. An intramuscular steroid injection was given in the office for immediate symptom control. The patient was prescribed a short course of oral steroids, topical steroid cream to apply twice daily for up to two weeks, and an antihistamine to take every six hours as needed for itching. Fragrance-free soap and body wash were recommended to prevent further irritation. The patient was educated that itching and rash progression should stop first, followed by gradual improvement in appearance over several days. Follow up with a primary care provider if symptoms persist or worsen, and return to the emergency department if signs of infection such as increasing redness, swelling, pus, or fever occur.  Today's evaluation has revealed no signs of a dangerous process. Discussed diagnosis with patient and/or guardian. Patient and/or guardian aware of their diagnosis, possible red flag symptoms to watch out for and need for close follow up. Patient and/or guardian understands verbal and written discharge instructions. Patient and/or guardian comfortable with plan and disposition.  Patient and/or guardian has a clear mental status at this time, good insight into illness (after discussion and teaching) and has clear judgment to make decisions regarding their care  Documentation was completed with the aid of voice recognition software. Transcription may contain typographical errors. Final Clinical Impressions(s) / UC Diagnoses   Final diagnoses:  Toxicodendron dermatitis  Pruritus     Discharge  Instructions      You have been diagnosed with contact dermatitis, likely caused by exposure to poison ivy or a similar plant irritant. You received a steroid injection in the clinic to help reduce inflammation. Take the prescribed oral steroid daily as directed and apply the topical steroid cream to affected areas twice a day for up to two weeks. An antihistamine has also been prescribed to help relieve itching; take it every six hours as needed. To manage symptoms at home, avoid scratching, wear loose clothing, and use fragrance-free soap or body wash to prevent further skin irritation. Examples of fragrance-free body washes include Dove Sensitive Skin Body Wash, Cetaphil Ultra Gentle Body Wash, Aveeno Skin Relief Fragrance-Free Body Wash, Vanicream Gentle Body Wash, Eucerin Advanced Cleansing Body & Face Cleanser, and Neutrogena Ultra Gentle Hydrating Cleanser. These options are gentle on the skin and suitable for use while healing from contact dermatitis.You may also apply cool compresses to help with itching or discomfort. The rash should stop spreading within a day or two, with gradual improvement in redness and swelling over several days. Contact your primary care provider if the rash does not improve after several days of treatment, new areas develop, or symptoms worsen. Go to the emergency department if you experience fever, increasing pain, redness or swelling, pus or drainage from the rash, or any difficulty breathing or swallowing.      ED Prescriptions     Medication Sig Dispense Auth. Provider   methylPREDNISolone  (MEDROL  DOSEPAK) 4 MG TBPK tablet Take as directed 21 tablet Maryruth Sol, FNP   diphenhydrAMINE (BENADRYL) 25 MG tablet Take 1 tablet (25 mg total) by mouth every 6 (six) hours as needed for itching. 20 tablet Altovise Wahler, FNP   triamcinolone cream (KENALOG) 0.1 % Apply 1 Application topically 2 (two) times daily. Apply to affected areas two times a day until  symptoms resolve for up to 2 weeks 30 g Maryruth Sol, FNP      PDMP not reviewed this encounter.   Beola Brazil [place], [place] 08/06/23 [phone]

## 2023-08-06 NOTE — Discharge Instructions (Addendum)
 You have been diagnosed with contact dermatitis, likely caused by exposure to poison ivy or a similar plant irritant. You received a steroid injection in the clinic to help reduce inflammation. Take the prescribed oral steroid daily as directed and apply the topical steroid cream to affected areas twice a day for up to two weeks. An antihistamine has also been prescribed to help relieve itching; take it every six hours as needed. To manage symptoms at home, avoid scratching, wear loose clothing, and use fragrance-free soap or body wash to prevent further skin irritation. Examples of fragrance-free body washes include Dove Sensitive Skin Body Wash, Cetaphil Ultra Gentle Body Wash, Aveeno Skin Relief Fragrance-Free Body Wash, Vanicream Gentle Body Wash, Eucerin Advanced Cleansing Body & Face Cleanser, and Neutrogena Ultra Gentle Hydrating Cleanser. These options are gentle on the skin and suitable for use while healing from contact dermatitis.You may also apply cool compresses to help with itching or discomfort. The rash should stop spreading within a day or two, with gradual improvement in redness and swelling over several days. Contact your primary care provider if the rash does not improve after several days of treatment, new areas develop, or symptoms worsen. Go to the emergency department if you experience fever, increasing pain, redness or swelling, pus or drainage from the rash, or any difficulty breathing or swallowing.

## 2023-12-26 ENCOUNTER — Ambulatory Visit: Admitting: Family Medicine
